# Patient Record
Sex: Female | Born: 1970 | Race: White | Hispanic: No | Marital: Married
Health system: Southern US, Community
[De-identification: ages and names within clinical notes are randomized; demographics above are authoritative.]

---

## 2022-02-13 ENCOUNTER — Inpatient Hospital Stay (HOSPITAL_COMMUNITY)
Admission: EM | Admit: 2022-02-13 | Discharge: 2022-02-17 | DRG: 035 | Disposition: A | Discharge status: Home / Self Care | Payer: No Typology Code available for payment source | Attending: Neurology | Admitting: Neurology

## 2022-02-13 ENCOUNTER — Emergency Department (HOSPITAL_COMMUNITY): Payer: No Typology Code available for payment source

## 2022-02-13 ENCOUNTER — Other Ambulatory Visit: Payer: Self-pay

## 2022-02-13 DIAGNOSIS — I63412 Cerebral infarction due to embolism of left middle cerebral artery: Principal | ICD-10-CM | POA: Diagnosis present

## 2022-02-13 DIAGNOSIS — I639 Cerebral infarction, unspecified: Secondary | ICD-10-CM | POA: Diagnosis not present

## 2022-02-13 DIAGNOSIS — R29707 NIHSS score 7: Secondary | ICD-10-CM | POA: Diagnosis present

## 2022-02-13 DIAGNOSIS — I959 Hypotension, unspecified: Secondary | ICD-10-CM | POA: Diagnosis not present

## 2022-02-13 DIAGNOSIS — I63512 Cerebral infarction due to unspecified occlusion or stenosis of left middle cerebral artery: Secondary | ICD-10-CM | POA: Diagnosis present

## 2022-02-13 DIAGNOSIS — I63232 Cerebral infarction due to unspecified occlusion or stenosis of left carotid arteries: Secondary | ICD-10-CM | POA: Diagnosis not present

## 2022-02-13 DIAGNOSIS — E7800 Pure hypercholesterolemia, unspecified: Secondary | ICD-10-CM | POA: Diagnosis not present

## 2022-02-13 DIAGNOSIS — E785 Hyperlipidemia, unspecified: Secondary | ICD-10-CM | POA: Diagnosis present

## 2022-02-13 DIAGNOSIS — I10 Essential (primary) hypertension: Secondary | ICD-10-CM | POA: Diagnosis present

## 2022-02-13 DIAGNOSIS — Z20822 Contact with and (suspected) exposure to covid-19: Secondary | ICD-10-CM | POA: Diagnosis present

## 2022-02-13 DIAGNOSIS — G43909 Migraine, unspecified, not intractable, without status migrainosus: Secondary | ICD-10-CM | POA: Diagnosis present

## 2022-02-13 DIAGNOSIS — Z9282 Status post administration of tPA (rtPA) in a different facility within the last 24 hours prior to admission to current facility: Secondary | ICD-10-CM | POA: Diagnosis not present

## 2022-02-13 DIAGNOSIS — D509 Iron deficiency anemia, unspecified: Secondary | ICD-10-CM | POA: Diagnosis present

## 2022-02-13 DIAGNOSIS — D649 Anemia, unspecified: Secondary | ICD-10-CM | POA: Diagnosis not present

## 2022-02-13 DIAGNOSIS — R4701 Aphasia: Secondary | ICD-10-CM | POA: Diagnosis present

## 2022-02-13 DIAGNOSIS — I69392 Facial weakness following cerebral infarction: Secondary | ICD-10-CM

## 2022-02-13 DIAGNOSIS — G8191 Hemiplegia, unspecified affecting right dominant side: Secondary | ICD-10-CM | POA: Diagnosis present

## 2022-02-13 DIAGNOSIS — I6522 Occlusion and stenosis of left carotid artery: Secondary | ICD-10-CM | POA: Diagnosis not present

## 2022-02-13 DIAGNOSIS — Z8669 Personal history of other diseases of the nervous system and sense organs: Secondary | ICD-10-CM | POA: Diagnosis not present

## 2022-02-13 DIAGNOSIS — Z8673 Personal history of transient ischemic attack (TIA), and cerebral infarction without residual deficits: Secondary | ICD-10-CM | POA: Diagnosis not present

## 2022-02-13 LAB — COMPREHENSIVE METABOLIC PANEL
ALT: 18 U/L (ref 0–44)
AST: 21 U/L (ref 15–41)
Albumin: 3.4 g/dL — ABNORMAL LOW (ref 3.5–5.0)
Alkaline Phosphatase: 91 U/L (ref 38–126)
Anion gap: 11 (ref 5–15)
BUN: 9 mg/dL (ref 6–20)
CO2: 21 mmol/L — ABNORMAL LOW (ref 22–32)
Calcium: 9.3 mg/dL (ref 8.9–10.3)
Chloride: 103 mmol/L (ref 98–111)
Creatinine, Ser: 0.87 mg/dL (ref 0.44–1.00)
GFR, Estimated: >60 mL/min (ref >60)
Glucose, Bld: 141 mg/dL — ABNORMAL HIGH (ref 70–99)
Potassium: 3.8 mmol/L (ref 3.5–5.1)
Sodium: 135 mmol/L (ref 135–145)
Total Bilirubin: 0.5 mg/dL (ref 0.3–1.2)
Total Protein: 7.1 g/dL (ref 6.5–8.1)

## 2022-02-13 LAB — DIFFERENTIAL
Abs Immature Granulocytes: 0.05 10*3/uL (ref 0.00–0.07)
Basophils Absolute: 0 10*3/uL (ref 0.0–0.1)
Basophils Relative: 0 %
Eosinophils Absolute: 0.6 10*3/uL — ABNORMAL HIGH (ref 0.0–0.5)
Eosinophils Relative: 5 %
Immature Granulocytes: 1 %
Lymphocytes Relative: 29 %
Lymphs Abs: 3.1 10*3/uL (ref 0.7–4.0)
Monocytes Absolute: 0.7 10*3/uL (ref 0.1–1.0)
Monocytes Relative: 6 %
Neutro Abs: 6.1 10*3/uL (ref 1.7–7.7)
Neutrophils Relative %: 59 %

## 2022-02-13 LAB — I-STAT BETA HCG BLOOD, ED (MC, WL, AP ONLY): I-stat hCG, quantitative: <5 m[IU]/mL (ref <5)

## 2022-02-13 LAB — I-STAT CHEM 8, ED
BUN: 10 mg/dL (ref 6–20)
Calcium, Ion: 1.18 mmol/L (ref 1.15–1.40)
Chloride: 105 mmol/L (ref 98–111)
Creatinine, Ser: 0.7 mg/dL (ref 0.44–1.00)
Glucose, Bld: 98 mg/dL (ref 70–99)
HCT: 36 % (ref 36.0–46.0)
Hemoglobin: 12.2 g/dL (ref 12.0–15.0)
Potassium: 4.1 mmol/L (ref 3.5–5.1)
Sodium: 136 mmol/L (ref 135–145)
TCO2: 21 mmol/L — ABNORMAL LOW (ref 22–32)

## 2022-02-13 LAB — CBC
HCT: 32.3 % — ABNORMAL LOW (ref 36.0–46.0)
Hemoglobin: 9.6 g/dL — ABNORMAL LOW (ref 12.0–15.0)
MCH: 20.3 pg — ABNORMAL LOW (ref 26.0–34.0)
MCHC: 29.7 g/dL — ABNORMAL LOW (ref 30.0–36.0)
MCV: 68.4 fL — ABNORMAL LOW (ref 80.0–100.0)
Platelets: 422 10*3/uL — ABNORMAL HIGH (ref 150–400)
RBC: 4.72 MIL/uL (ref 3.87–5.11)
RDW: 17 % — ABNORMAL HIGH (ref 11.5–15.5)
WBC: 10.5 10*3/uL (ref 4.0–10.5)
nRBC: 0 % (ref 0.0–0.2)

## 2022-02-13 LAB — ETHANOL: Alcohol, Ethyl (B): <10 mg/dL (ref <10)

## 2022-02-13 LAB — HEMOGLOBIN A1C
Hgb A1c MFr Bld: 5.6 % (ref 4.8–5.6)
Mean Plasma Glucose: 114.02 mg/dL

## 2022-02-13 LAB — PROTIME-INR
INR: 1 (ref 0.8–1.2)
Prothrombin Time: 12.6 seconds (ref 11.4–15.2)

## 2022-02-13 LAB — CBG MONITORING, ED: Glucose-Capillary: 95 mg/dL (ref 70–99)

## 2022-02-13 LAB — APTT: aPTT: 22 seconds — ABNORMAL LOW (ref 24–36)

## 2022-02-13 IMAGING — MR MR HEAD W/O CM
9 series · 48 of 48 positions shown · non-contrast
Comparison: No prior MRI, correlation is made with [DATE] CT
head and CTA head neck

CLINICAL DATA: Right facial droop, right-sided weakness

EXAM:
MRI HEAD WITHOUT CONTRAST
TECHNIQUE: Multiplanar, multiecho pulse sequences of the brain and surrounding
structures were obtained without intravenous contrast.

[Series 5: DWI · axial · 3.0mm · 0.88mm/px · z∈[-97,+50]mm · 11 of 100 slices shown (1 of 4)]
[im 1/100]
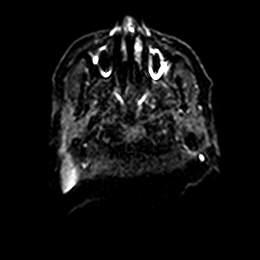
[im 10/100]
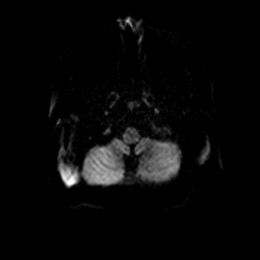
[im 20/100]
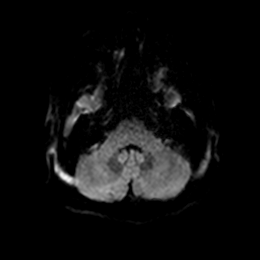
[im 30/100]
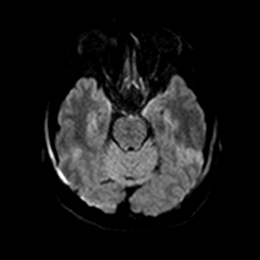
[im 40/100]
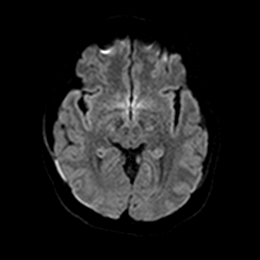
[im 50/100]
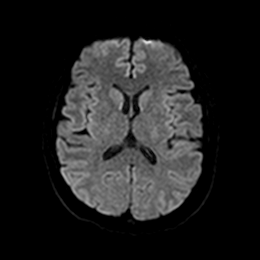
[im 60/100]
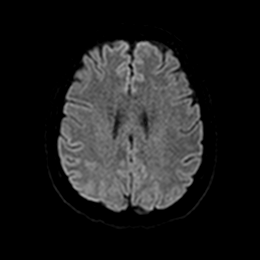
[im 70/100]
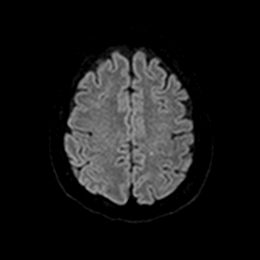
[im 80/100]
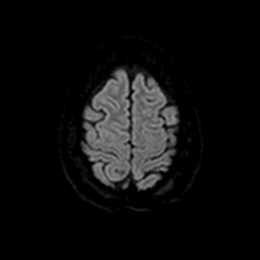
[im 90/100]
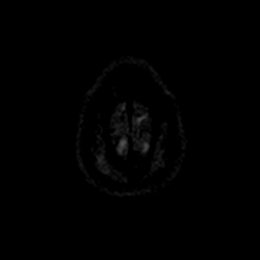
[im 100/100]
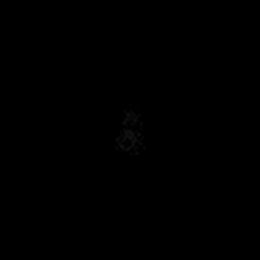

[Series 6: DWI · axial · 3.0mm · 0.88mm/px · z∈[-97,+50]mm · 6 of 50 slices shown (2 of 4)]
[im 1/50]
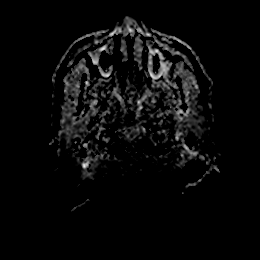
[im 10/50]
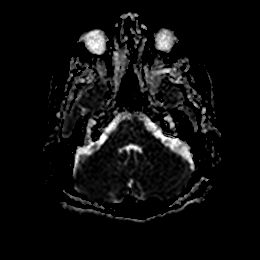
[im 20/50]
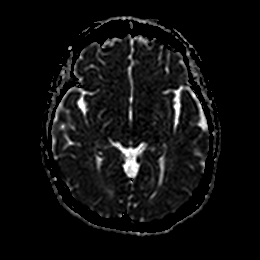
[im 30/50]
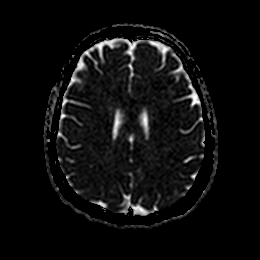
[im 40/50]
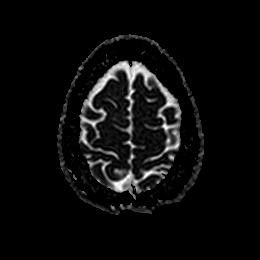
[im 50/50]
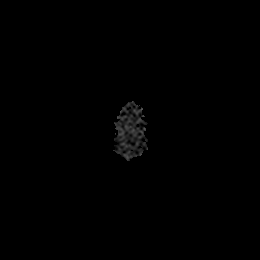

[Series 7: DWI · coronal · 4.0mm · 0.88mm/px · 7 of 64 slices shown (3 of 4)]
[im 1/64]
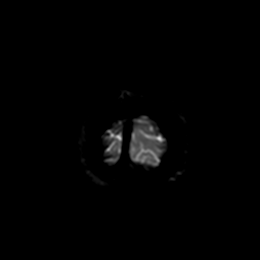
[im 11/64]
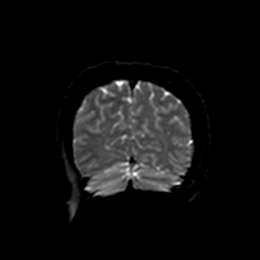
[im 22/64]
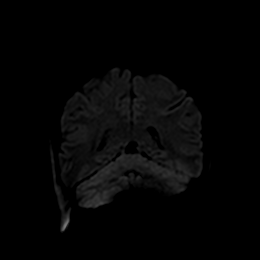
[im 32/64]
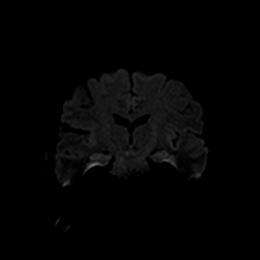
[im 43/64]
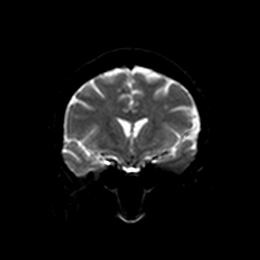
[im 53/64]
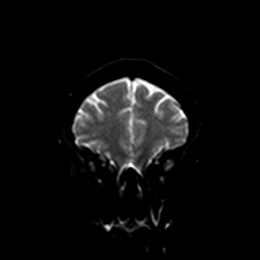
[im 64/64]
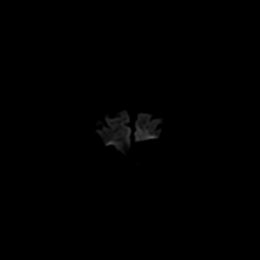

[Series 8: DWI · coronal · 4.0mm · 0.88mm/px · 4 of 32 slices shown (4 of 4)]
[im 1/32]
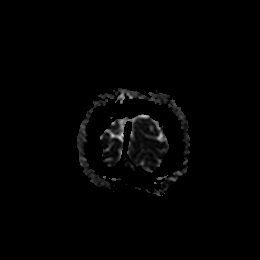
[im 11/32]
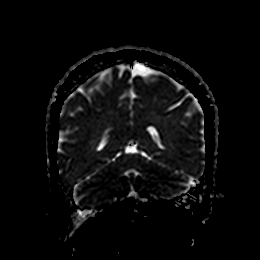
[im 21/32]
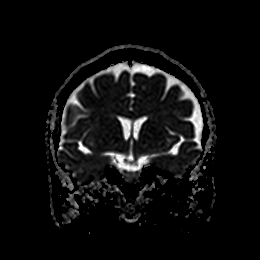
[im 32/32]
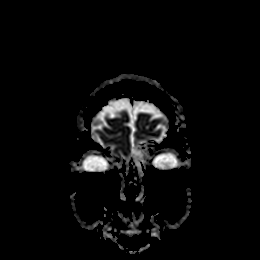

[Series 9: T1 · sagittal · 5.0mm · 0.75mm/px · 3 of 23 slices shown]
[im 1/23]
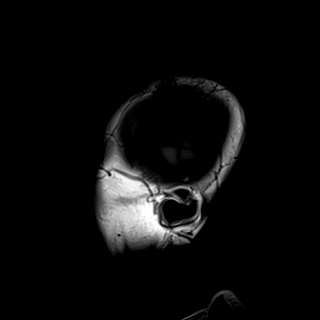
[im 12/23]
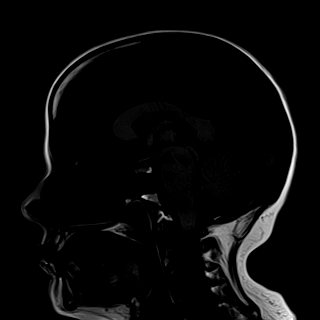
[im 23/23]
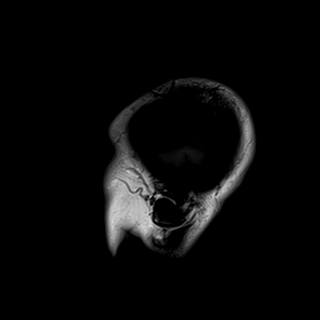

[Series 10: T2 · axial · 5.0mm · 0.72mm/px · z∈[-95,+49]mm · 3 of 25 slices shown]
[im 1/25]
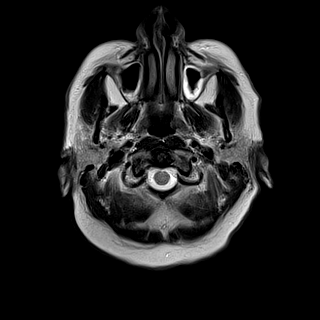
[im 13/25]
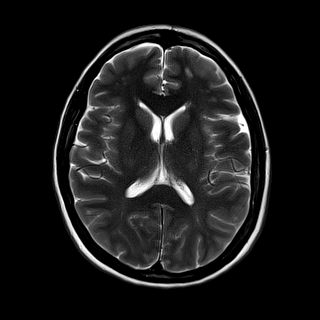
[im 25/25]
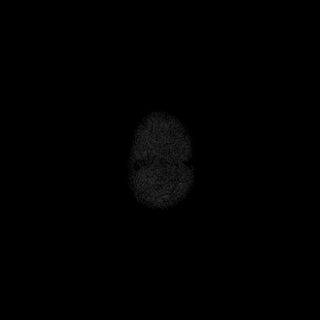

[Series 12: pha_images · axial · 3.0mm · 0.90mm/px · z∈[-112,+59]mm · 6 of 56 slices shown]
[im 1/56]
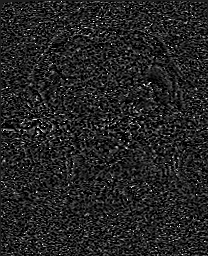
[im 12/56]
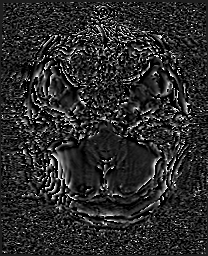
[im 23/56]
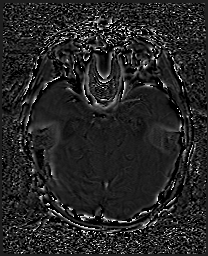
[im 34/56]
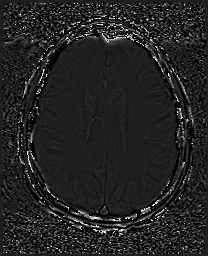
[im 45/56]
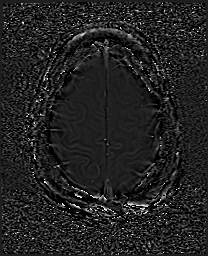
[im 56/56]
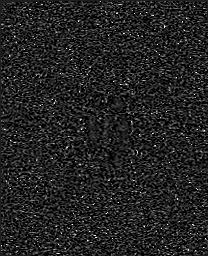

[Series 13: swi_images · axial · 3.0mm · 0.90mm/px · z∈[-112,+65]mm · 7 of 60 slices shown]
[im 1/60]
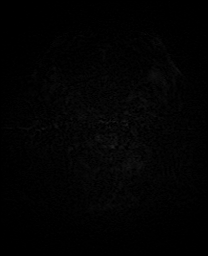
[im 10/60]
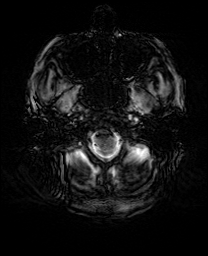
[im 20/60]
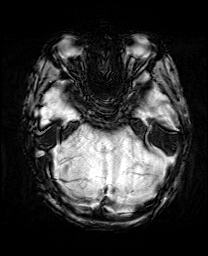
[im 30/60]
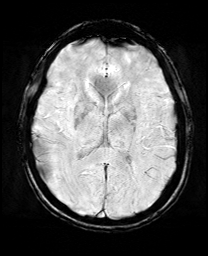
[im 40/60]
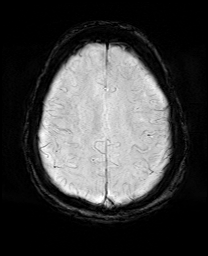
[im 50/60]
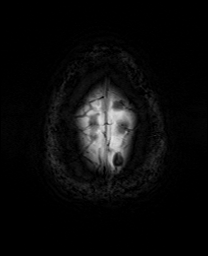
[im 60/60]
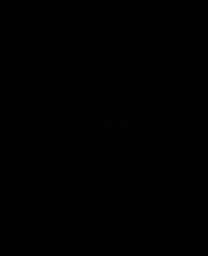

[Series 15: FLAIR · axial · 5.0mm · 0.45mm/px · 1 of 13 slices shown]
[im 1/13]
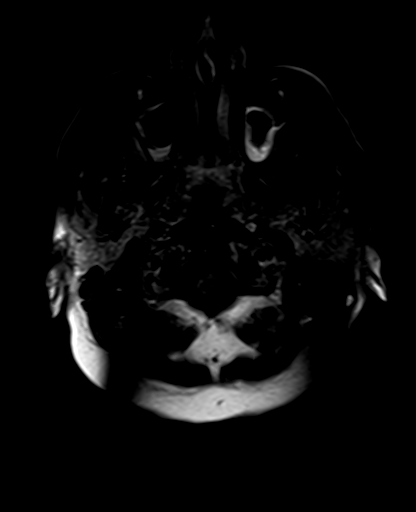

[48 of 48 positions shown; findings below may reference images not displayed]

FINDINGS: Evaluation is somewhat limited by early termination of the study. No
axial T1 sequence was obtained.

Brain: Punctate foci of restricted diffusion in the left parietal
lobe (series 5, images 79-80) and left frontal lobe (series 5,
images 81-85 and 88). No acute hemorrhage, mass, mass effect, or
midline shift. No hydrocephalus or extra-axial collection. Scattered
T2 hyperintense signal in the periventricular white matter, likely
the sequela of chronic small vessel ischemic disease.

Vascular: Normal flow voids.

Skull and upper cervical spine: Normal marrow signal.

Sinuses/Orbits: Mucosal thickening in the left frontal sinus,
ethmoid air cells, and maxillary sinuses.

Other: The mastoids are well aerated.
IMPRESSION: Punctate acute infarcts in the left frontal and parietal lobes.

## 2022-02-13 IMAGING — CT CT HEAD CODE STROKE
3 series · 15 of 47 positions shown, 18 images · non-contrast
Comparison: None Available.

CLINICAL DATA: Code stroke.  Stroke suspected



[Series 3: head 5.0 st · axial · 0.43mm/px · z∈[-139,-4]mm · 9 of 33 slices shown, 12 images]
[im 3/33  brain]
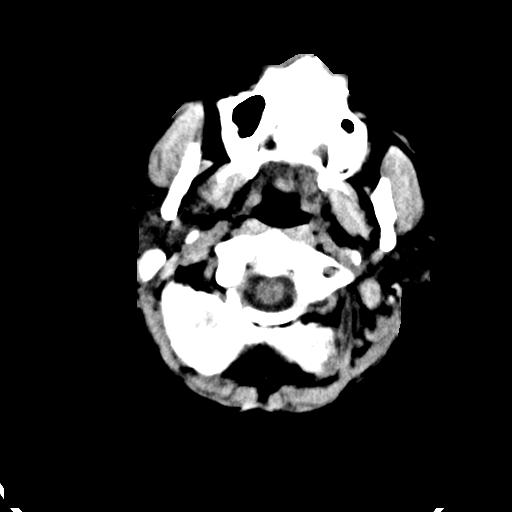
[im 3/33  bone]
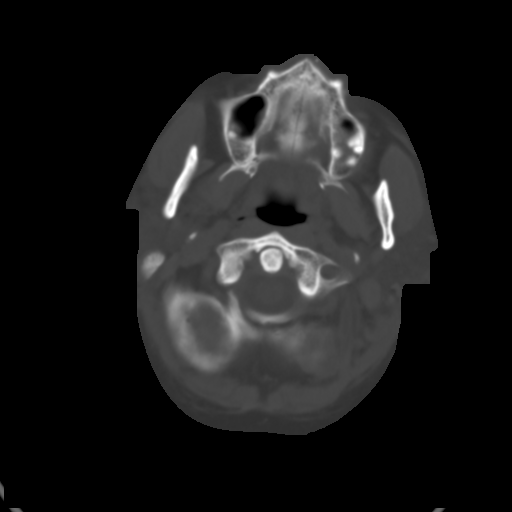
[im 6/33  brain]
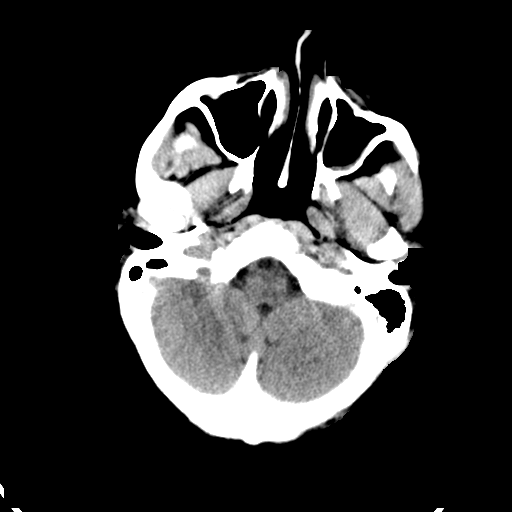
[im 9/33  brain]
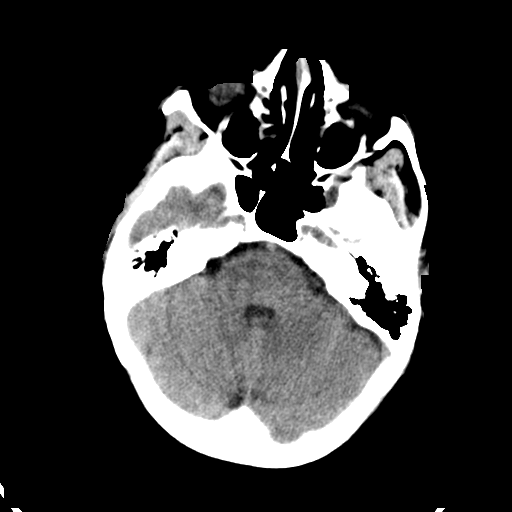
[im 13/33  brain]
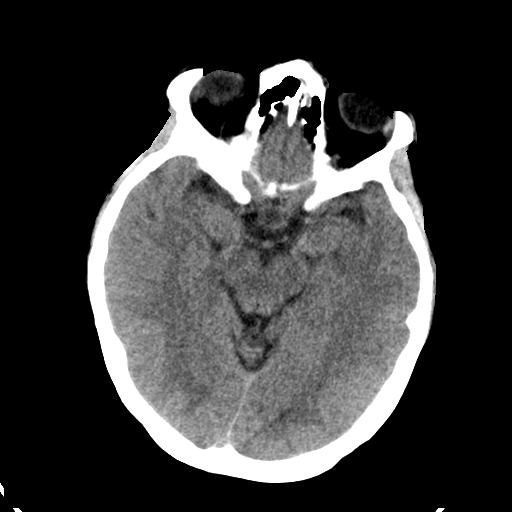
[im 17/33  brain]
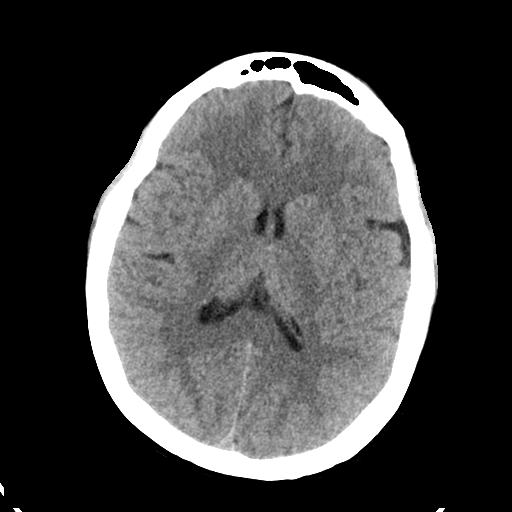
[im 17/33  bone]
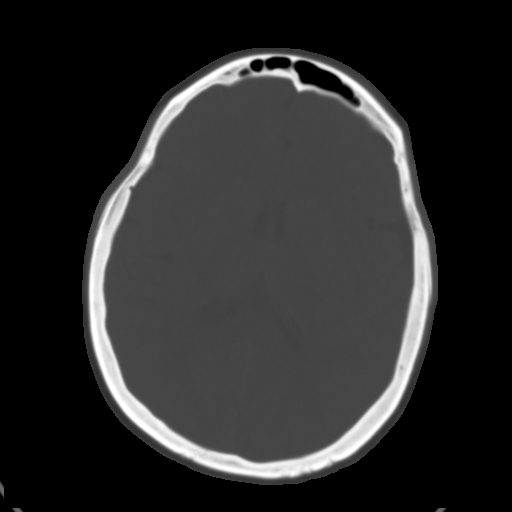
[im 20/33  brain]
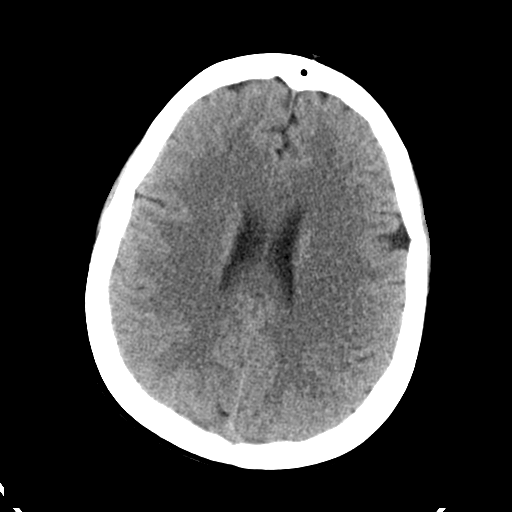
[im 24/33  brain]
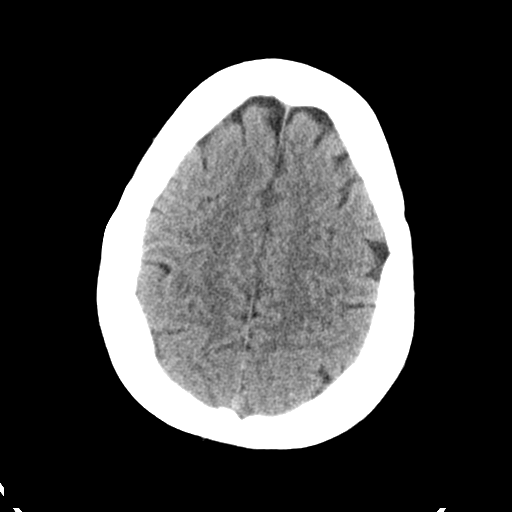
[im 27/33  brain]
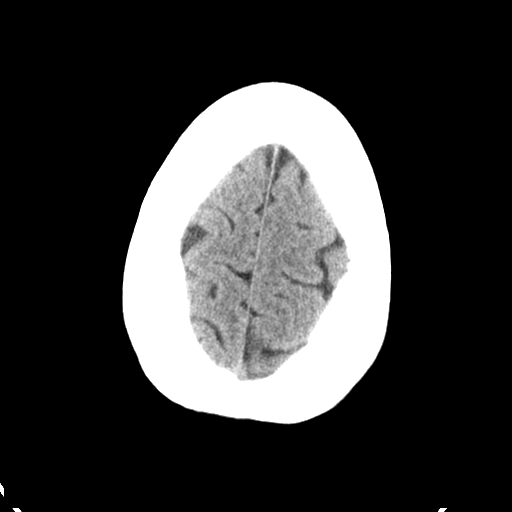
[im 30/33  brain]
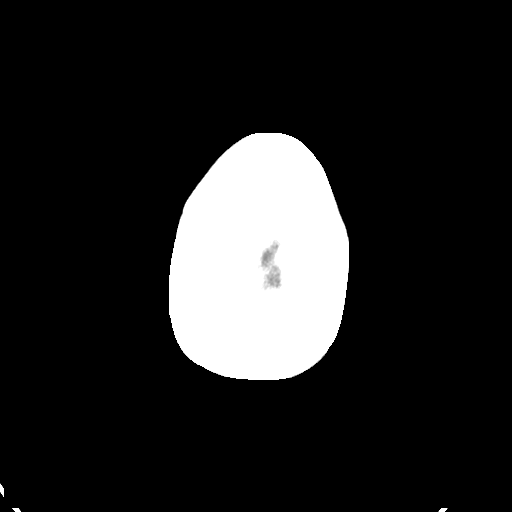
[im 30/33  bone]
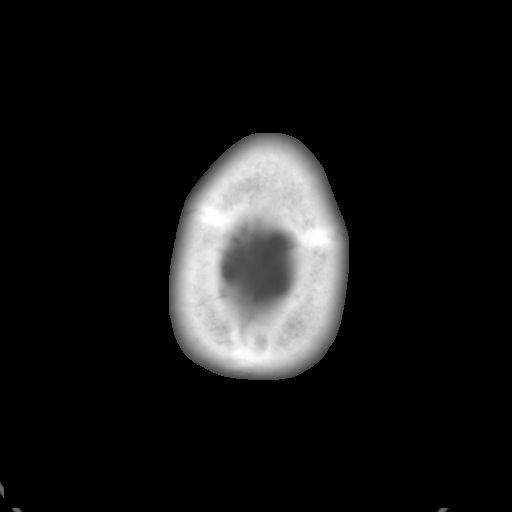

[Series 5: head 3.0 cor st · coronal · 0.35mm/px · 3 of 69 slices shown]
[im 23/69  brain]
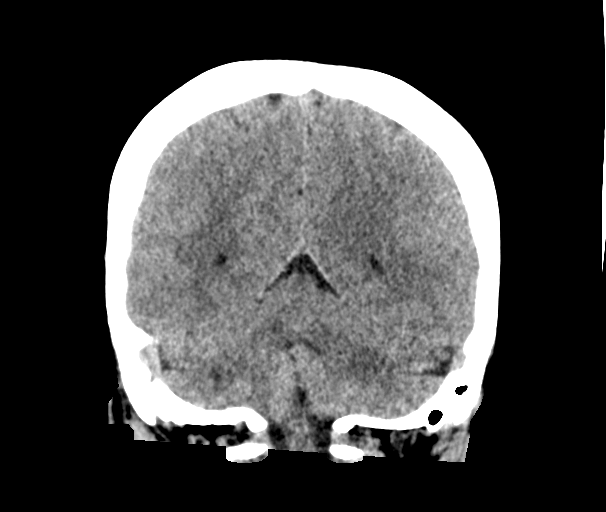
[im 31/69  brain]
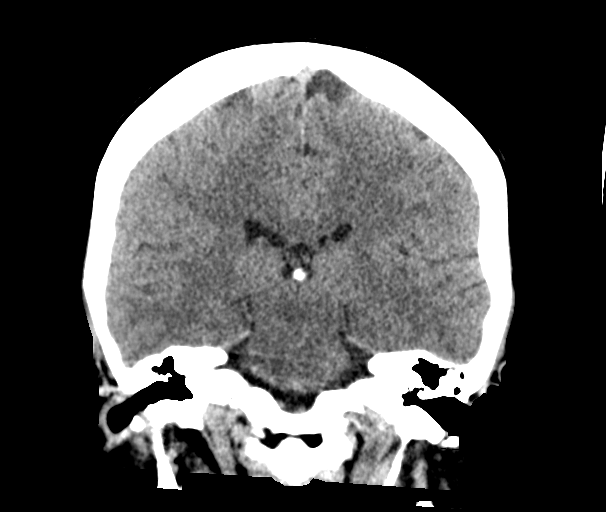
[im 38/69  brain]
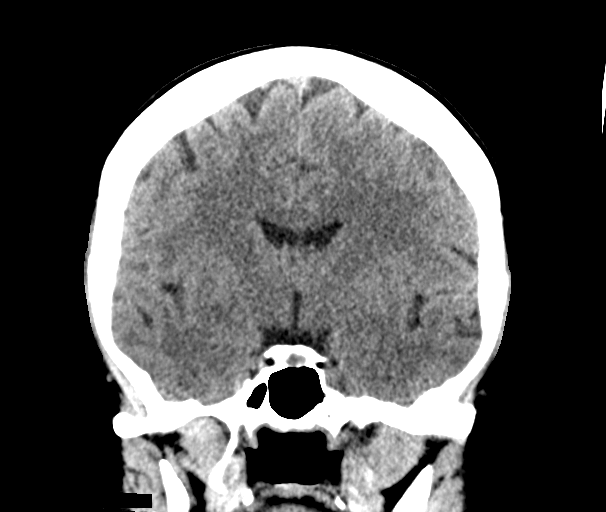

[Series 6: head 3.0 sag st · sagittal · 0.37mm/px · 3 of 58 slices shown]
[im 20/58  brain]
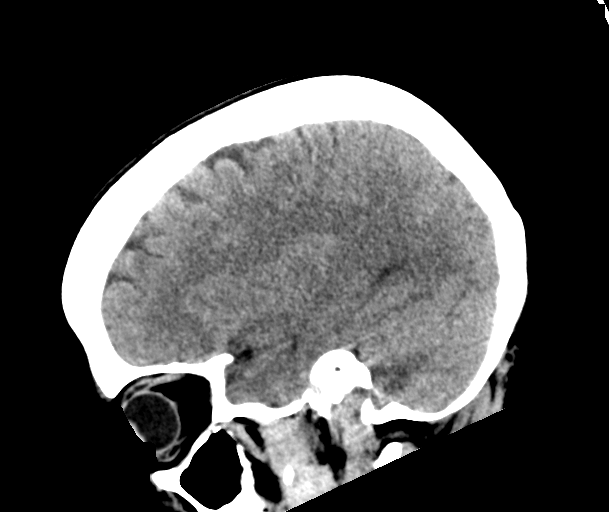
[im 29/58  brain]
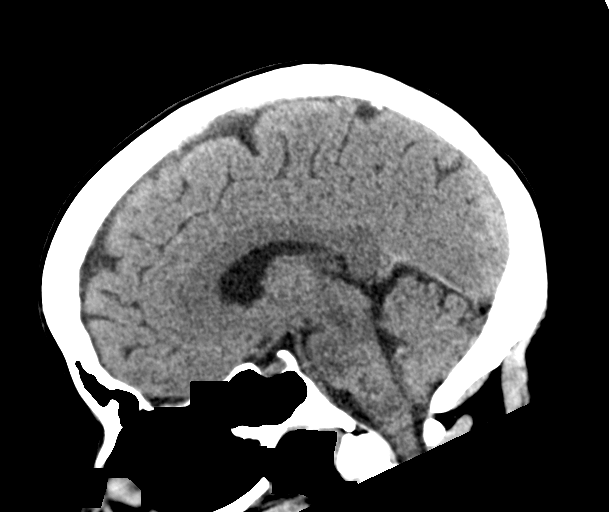
[im 39/58  brain]
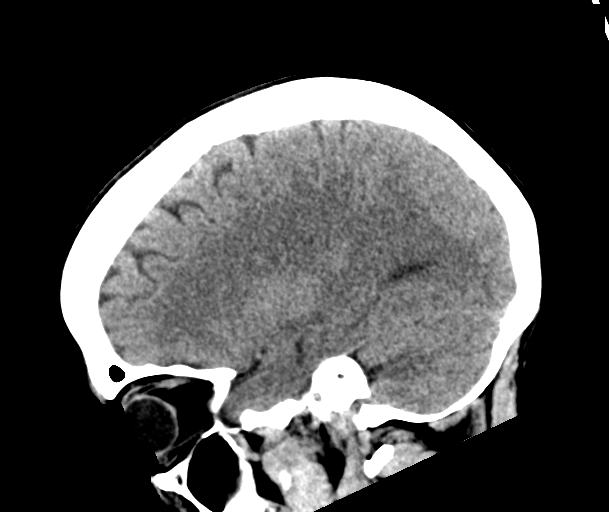

[15 of 47 positions shown; findings below may reference images not displayed]

FINDINGS: Brain: No evidence of acute infarction, hemorrhage, cerebral edema,
mass, mass effect, or midline shift. Ventricles and sulci are normal
for age. No extra-axial fluid collection.

Vascular: No hyperdense vessel.

Skull: Negative for fracture or focal lesion.

Sinuses/Orbits: Mucosal thickening in the maxillary sinuses, ethmoid
air cells, and left frontal sinus. The orbits are unremarkable.

Other: The mastoid air cells are well aerated.

ASPECTS (Alberta Stroke Program Early CT Score)

- Ganglionic level infarction (caudate, lentiform nuclei, internal
capsule, insula, M1-M3 cortex): 7

- Supraganglionic infarction (M4-M6 cortex): 3

Total score (0-10 with 10 being normal): 10
IMPRESSION: 1. No acute intracranial process.
2. ASPECTS is 10

Code stroke imaging results were communicated on [DATE] at [DATE] to provider ERARSLAN via secure text paging.

## 2022-02-13 MED ORDER — SENNOSIDES-DOCUSATE SODIUM 8.6-50 MG PO TABS: 1.0000 | ORAL_TABLET | Freq: Every evening | ORAL | Status: DC | PRN

## 2022-02-13 MED ORDER — ACETAMINOPHEN 650 MG RE SUPP: 650.0000 mg | RECTAL | Status: DC | PRN

## 2022-02-13 MED ORDER — TENECTEPLASE FOR STROKE
0.2500 mg/kg | PACK | Freq: Once | INTRAVENOUS | Status: AC
  Administered 2022-02-13: 20 mg via INTRAVENOUS
  Filled 2022-02-13: qty 10

## 2022-02-13 MED ORDER — LABETALOL HCL 5 MG/ML IV SOLN: 5.0000 mg | INTRAVENOUS | Status: DC | PRN

## 2022-02-13 MED ORDER — CHLORHEXIDINE GLUCONATE CLOTH 2 % EX PADS
6.0000 | MEDICATED_PAD | Freq: Every day | CUTANEOUS | Status: DC
  Administered 2022-02-14 – 2022-02-16 (×2): 6 via TOPICAL

## 2022-02-13 MED ORDER — LABETALOL HCL 5 MG/ML IV SOLN
20.0000 mg | Freq: Once | INTRAVENOUS | Status: AC
  Administered 2022-02-13: 20 mg via INTRAVENOUS

## 2022-02-13 MED ORDER — ONDANSETRON HCL 4 MG/2ML IJ SOLN
4.0000 mg | Freq: Four times a day (QID) | INTRAMUSCULAR | Status: DC | PRN
  Administered 2022-02-13: 4 mg via INTRAVENOUS
  Filled 2022-02-13 (×2): qty 2

## 2022-02-13 MED ORDER — ACETAMINOPHEN 160 MG/5ML PO SOLN: 650.0000 mg | ORAL | Status: DC | PRN

## 2022-02-13 MED ORDER — FENTANYL CITRATE PF 50 MCG/ML IJ SOSY
PREFILLED_SYRINGE | INTRAMUSCULAR | Status: AC
  Filled 2022-02-13: qty 1

## 2022-02-13 MED ORDER — PANTOPRAZOLE SODIUM 40 MG IV SOLR
40.0000 mg | Freq: Every day | INTRAVENOUS | Status: DC
  Administered 2022-02-13: 40 mg via INTRAVENOUS
  Filled 2022-02-13: qty 10

## 2022-02-13 MED ORDER — SODIUM CHLORIDE 0.9 % IV SOLN: INTRAVENOUS | Status: DC

## 2022-02-13 MED ORDER — FENTANYL CITRATE PF 50 MCG/ML IJ SOSY
25.0000 ug | PREFILLED_SYRINGE | Freq: Once | INTRAMUSCULAR | Status: AC
  Administered 2022-02-13: 25 ug via INTRAVENOUS

## 2022-02-13 MED ORDER — STROKE: EARLY STAGES OF RECOVERY BOOK
Freq: Once | Status: AC
  Filled 2022-02-13: qty 1

## 2022-02-13 MED ORDER — ACETAMINOPHEN 325 MG PO TABS
650.0000 mg | ORAL_TABLET | ORAL | Status: DC | PRN
  Administered 2022-02-14 (×3): 650 mg via ORAL
  Filled 2022-02-13 (×3): qty 2

## 2022-02-13 MED ORDER — IOHEXOL 350 MG/ML SOLN
100.0000 mL | Freq: Once | INTRAVENOUS | Status: AC | PRN
  Administered 2022-02-13: 100 mL via INTRAVENOUS

## 2022-02-13 NOTE — ED Triage Notes (Signed)
Pt presents via EMS was reportedly on the way home from dinner and experienced right sided arm paralysis and right leg heaviness as well as trouble finding her words.

## 2022-02-13 NOTE — Progress Notes (Signed)
PHARMACIST CODE STROKE RESPONSE  Notified to mix TNK at 2222  by Dr. Iver Nestle Delivered TNK to RN at 1035  TNK dose = 20 mg IV over 5 seconds  Issues/delays encountered (if applicable): Decision to give TPA was made while patient was in MRI. Upon arrival symptoms were questionable and CT scan did not show large vessel occlusion. The notification to mix was called to the pharmacist working in the main pharmacy after the ED pharmacist left for the evening. BP initially high and labetolol was given prior to delivering TNKase to RN.  Ruben Im, PharmD Clinical Pharmacist 02/13/2022 10:59 PM Please check AMION for all [facility] numbers

## 2022-02-13 NOTE — ED Provider Notes (Signed)
[facility] EMERGENCY DEPARTMENT Provider Note   CSN: [id number] Arrival date & time: 02/13/22  2114     History  No chief complaint on file.   Erika Jordan is a 51 y.o. female.  Level 5 caveat for acuity of condition.  Patient arrives via EMS as code stroke.  She was apparently driving in the car with family when she developed weakness, numbness to her right arm and right face with difficulty speaking.  Last seen normal about 8:35 PM.  Patient with difficulty communicating.  She complains of a headache and right-sided weakness and difficulty speaking.  Reportedly has no medical conditions and takes no medications. She does report a history of migraines but has never had a headache like this in the past and has never had the weakness from her headaches in the past.  The history is provided by the patient and the EMS personnel. The history is limited by the condition of the patient.      Home Medications Prior to Admission medications   Not on File      Allergies    Patient has no allergy information on record.    Review of Systems   Review of Systems  Unable to perform ROS: Acuity of condition   Physical Exam Updated Vital Signs BP (!) 168/89   Pulse 71   Temp 98 F (36.7 C) (Oral)   Resp 20   Wt 79.6 kg   SpO2 100%  Physical Exam Vitals and nursing note reviewed.  Constitutional:      General: She is not in acute distress.    Appearance: She is well-developed.  HENT:     Head: Normocephalic and atraumatic.     Mouth/Throat:     Pharynx: No oropharyngeal exudate.  Eyes:     Conjunctiva/sclera: Conjunctivae normal.     Pupils: Pupils are equal, round, and reactive to light.  Neck:     Comments: No meningismus. Cardiovascular:     Rate and Rhythm: Normal rate and regular rhythm.     Heart sounds: Normal heart sounds. No murmur heard. Pulmonary:     Effort: Pulmonary effort is normal. No respiratory distress.     Breath sounds: Normal  breath sounds.  Abdominal:     Palpations: Abdomen is soft.     Tenderness: There is no abdominal tenderness. There is no guarding or rebound.  Musculoskeletal:        General: No tenderness. Normal range of motion.     Cervical back: Normal range of motion and neck supple.  Skin:    General: Skin is warm.  Neurological:     Mental Status: She is alert and oriented to person, place, and time.     Cranial Nerves: No cranial nerve deficit.     Motor: No abnormal muscle tone.     Coordination: Coordination normal.     Comments: Decreased grip strength on the right, difficulty raising right arm off the bed.  Decreased strength raising right leg off the bed.  5/5 strength on the left.  No facial asymmetry.  Tongue is midline. Pronator drift present on the right  Psychiatric:        Behavior: Behavior normal.    ED Results / Procedures / Treatments   Labs (all labs ordered are listed, but only abnormal results are displayed) Labs Reviewed  APTT - Abnormal; Notable for the following components:      Result Value   aPTT 22 (*)    All  other components within normal limits  CBC - Abnormal; Notable for the following components:   Hemoglobin 9.6 (*)    HCT 32.3 (*)    MCV 68.4 (*)    MCH 20.3 (*)    MCHC 29.7 (*)    RDW 17.0 (*)    Platelets 422 (*)    All other components within normal limits  DIFFERENTIAL - Abnormal; Notable for the following components:   Eosinophils Absolute 0.6 (*)    All other components within normal limits  COMPREHENSIVE METABOLIC PANEL - Abnormal; Notable for the following components:   CO2 21 (*)    Glucose, Bld 141 (*)    Albumin 3.4 (*)    All other components within normal limits  I-STAT CHEM 8, ED - Abnormal; Notable for the following components:   TCO2 21 (*)    All other components within normal limits  RESP PANEL BY RT-PCR (FLU A&B, COVID) ARPGX2  ETHANOL  PROTIME-INR  HEMOGLOBIN A1C  RAPID URINE DRUG SCREEN, HOSP PERFORMED  URINALYSIS,  ROUTINE W REFLEX MICROSCOPIC  LIPID PANEL  HIV ANTIBODY (ROUTINE TESTING W REFLEX)  CBG MONITORING, ED  I-STAT BETA HCG BLOOD, ED ([facility], [facility], [facility] ONLY)    EKG EKG Interpretation  Date/Time:  Sunday Feb 13 2022 21:53:30 EDT Ventricular Rate:  88 PR Interval:  134 QRS Duration: 89 QT Interval:  374 QTC Calculation: 453 R Axis:   38 Text Interpretation: Sinus or ectopic atrial rhythm Low voltage, precordial leads No previous ECGs available Confirmed by Glynn Octave [phone]) on 02/13/2022 9:55:45 PM  Radiology MR BRAIN WO CONTRAST  Result Date: 02/13/2022 CLINICAL DATA:  Right facial droop, right-sided weakness EXAM: MRI HEAD WITHOUT CONTRAST TECHNIQUE: Multiplanar, multiecho pulse sequences of the brain and surrounding structures were obtained without intravenous contrast. COMPARISON:  No prior MRI, correlation is made with 02/13/2022 CT head and CTA head neck FINDINGS: Evaluation is somewhat limited by early termination of the study. No axial T1 sequence was obtained. Brain: Punctate foci of restricted diffusion in the left parietal lobe (series 5, images 79-80) and left frontal lobe (series 5, images 81-85 and 88). No acute hemorrhage, mass, mass effect, or midline shift. No hydrocephalus or extra-axial collection. Scattered T2 hyperintense signal in the periventricular white matter, likely the sequela of chronic small vessel ischemic disease. Vascular: Normal flow voids. Skull and upper cervical spine: Normal marrow signal. Sinuses/Orbits: Mucosal thickening in the left frontal sinus, ethmoid air cells, and maxillary sinuses. Other: The mastoids are well aerated. 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 Electronically Signed   By: Wiliam Ke M.D.   On: 02/13/2022 23:00   CT HEAD CODE STROKE WO CONTRAST  Result Date: 02/13/2022 CLINICAL DATA:  Code stroke.  Stroke suspected EXAM: CT HEAD WITHOUT CONTRAST TECHNIQUE: Contiguous axial images were obtained  from the base of the skull through the vertex without intravenous contrast. RADIATION DOSE REDUCTION: This exam was performed according to the departmental dose-optimization program which includes automated exposure control, adjustment of the mA and/or kV according to patient size and/or use of iterative reconstruction technique. COMPARISON:  None Available. FINDINGS: Brain: No evidence of acute infarction, hemorrhage, cerebral edema, mass, mass effect, or midline shift. Ventricles and sulci are normal for age. No extra-axial fluid collection. Vascular: No hyperdense vessel. Skull: Negative for fracture or focal lesion. Sinuses/Orbits: Mucosal thickening in the maxillary sinuses, ethmoid air cells, and left frontal sinus. The orbits are unremarkable. Other: The mastoid air cells are well aerated.  ASPECTS [facility](Alberta Stroke Program Early CT Score) - Ganglionic level infarction (caudate, lentiform nuclei, internal capsule, insula, M1-M3 cortex): 7 - Supraganglionic infarction (M4-M6 cortex): 3 Total score (0-10 with 10 being normal): 10 IMPRESSION: 1. No acute intracranial process. 2. ASPECTS is 10 Code stroke imaging results were communicated on 02/13/2022 at 9:27 pm to provider BHAGAT via secure text paging. Electronically Signed   By: Wiliam KeAlison  Vasan M.D.   On: 02/13/2022 21:28   CT ANGIO HEAD NECK W WO CM W PERF (CODE STROKE)  Result Date: 02/13/2022 CLINICAL DATA:  Right facial droop, right-sided weakness, stroke suspected EXAM: CT ANGIOGRAPHY HEAD AND NECK CT PERFUSION TECHNIQUE: Multidetector CT imaging of the head and neck was performed using the standard protocol during bolus administration of intravenous contrast. Multiplanar CT image reconstructions and MIPs were obtained to evaluate the vascular anatomy. Carotid stenosis measurements (when applicable) are obtained utilizing NASCET criteria, using the distal internal carotid diameter as the denominator. Multiphase CT imaging of the brain was performed  following IV bolus contrast injection. Subsequent parametric perfusion maps were calculated using RAPID software. RADIATION DOSE REDUCTION: This exam was performed according to the departmental dose-optimization program which includes automated exposure control, adjustment of the mA and/or kV according to patient size and/or use of iterative reconstruction technique. CONTRAST:  100 mL Omnipaque 350 COMPARISON:  No prior CTA, correlation is made with CT head 02/13/2022 FINDINGS: CT HEAD FINDINGS For noncontrast findings, please see same day CT head. CTA NECK FINDINGS Aortic arch: Standard branching. Imaged portion shows no evidence of aneurysm or dissection. No significant stenosis of the major arch vessel origins. Right carotid system: No evidence of dissection, occlusion, or hemodynamically significant stenosis (greater than 50%). Left carotid system: Filling defect along the medial aspect of the proximal left ICA, extending into the lumen (series 7, images 202-204), which may represent intraluminal thrombus. No evidence of dissection, occlusion, or hemodynamically significant stenosis (greater than 50%). Vertebral arteries: No evidence of dissection, occlusion, or hemodynamically significant stenosis (greater than 50%). Skeleton: No acute osseous abnormality. Other neck: No acute finding. Upper chest: No focal pulmonary opacity or pleural effusion. Review of the MIP images confirms the above findings CTA HEAD FINDINGS Anterior circulation: Both internal carotid arteries are patent to the termini, without significant stenosis. A1 segments patent. Normal anterior communicating artery. Anterior cerebral arteries are patent to their distal aspects. No M1 stenosis or occlusion, with irregularity of the left-greater-than-right M1 normal MCA bifurcations. Distal MCA branches perfused and symmetric. Posterior circulation: Vertebral arteries patent to the vertebrobasilar junction without stenosis. Posterior inferior  cerebellar arteries patent proximally. Basilar patent to its distal aspect. Superior cerebellar arteries patent proximally. Patent P1 segments. PCAs perfused to their distal aspects without stenosis. The right posterior communicating artery is patent. Venous sinuses: As permitted by contrast timing, patent. Anatomic variants: None significant. Review of the MIP images confirms the above findings CT Brain Perfusion Findings: ASPECTS: 10 CBF (<30%) Volume: 0mL Perfusion (Tmax>6.0s) volume: 0mL Mismatch Volume: 0mL Infarction Location:None IMPRESSION: 1. Filling defect in the proximal left ICA, concerning for intraluminal thrombus. No hemodynamically significant stenosis in the neck. 2.  No intracranial large vessel occlusion or significant stenosis. 3. No infarct core or penumbra on CT perfusion. Code stroke imaging results were communicated on 02/13/2022 at 10:06 pm to provider BHAGAT via secure text paging. Electronically Signed   By: Wiliam KeAlison  Vasan M.D.   On: 02/13/2022 22:07    Procedures .Critical Care Performed by: Glynn Octaveancour, Felix Pratt, MD Authorized by: Glynn Octaveancour, Lima Chillemi, MD  Critical care provider statement:    Critical care time (minutes):  45   Critical care time was exclusive of:  Separately billable procedures and treating other patients   Critical care was necessary to treat or prevent imminent or life-threatening deterioration of the following conditions:  CNS failure or compromise   Critical care was time spent personally by me on the following activities:  Development of treatment plan with patient or surrogate, discussions with consultants, evaluation of patient's response to treatment, examination of patient, ordering and review of laboratory studies, ordering and review of radiographic studies, ordering and performing treatments and interventions, pulse oximetry, re-evaluation of patient's condition and review of old charts   I assumed direction of critical care for this patient from another  provider in my specialty: no     Care discussed with: admitting provider      Medications Ordered in ED Medications - No data to display  ED Course/ Medical Decision Making/ A&P                           Medical Decision Making Amount and/or Complexity of Data Reviewed Labs: ordered. Radiology: ordered.  Risk Prescription drug management. Decision regarding hospitalization.  Code stroke seen on arrival with Dr. Clemmie Krill neurology.  Patient taken emergently to CT scanner. Patient with right-sided weakness, difficulty speaking as well as a headache.  She does have a history of migraines but has never had this happen before with the weakness.  Uncertain if this headache is similar to her previous migraines  CT is negative for hemorrhage.  Results reviewed and interpreted by me.  CT concerning for filling defect in left carotid artery.  Patient taken emergently to MRI by neurology team and Dr. Iver Nestle.  MRI is positive for stroke.  Neurology planning TNK after discussion of risks and benefits as patient is within the window for lytics.  Hemoglobin 9.6, no comparison.  No history of recent bleeding episodes.  Patient to be given TNK per neurology recommendations.  She is agreeable after discussion of risk and benefits with neurology at bedside.  We will need admission to neuro ICU.  Angiocath insertion Performed by: Glynn Octave  Consent: Verbal consent obtained. Risks and benefits: risks, benefits and alternatives were discussed Time out: Immediately prior to procedure a "time out" was called to verify the correct patient, procedure, equipment, support staff and site/side marked as required.  Preparation: Patient was prepped and draped in the usual sterile fashion.  Vein Location: L AC  Ultrasound Guided  Gauge: 20  Normal blood return and flush without difficulty Patient tolerance: Patient tolerated the procedure well with no immediate  complications.         Final Clinical Impression(s) / ED Diagnoses Final diagnoses:  None    Rx / DC Orders ED Discharge Orders     None         Jacoria Keiffer, Jeannett Senior, MD 02/13/22 2322

## 2022-02-13 NOTE — Consult Note (Addendum)
Neurology Consultation Reason for Consult: Right sided weakness and aphasia Requesting Physician: Ezequiel Essex  CC: Right sided weakness and aphasia  History is obtained from: Primarily from daughter and EMS given patient's confusion  HPI: Erika Jordan is a 51 y.o. left-handed woman with a past medical history significant for migraine headaches (not on any medications), presenting with headache with right-sided weakness and difficulty speaking/confusion.  She is visiting her daughter Erika Jordan, who recently moved to the area from [place].  She was in her normal state of health today and had gone out to dinner with the family.  Shortly after returning the family had stepped out of the room briefly and came back to find her slumped over with right arm weakness and difficulty communicating, for which EMS was activated.  Her initial blood pressure with EMS was 220/140, glucose within normal limits, with her examination stable over the course of their evaluation and transport  Regarding her headaches, she has a known history of migraine headaches but does not take any preventative medications.  These headaches typically are associated with some confusion and difficulty talking, but she has never had right-sided weakness before.  She will sometimes take Excedrin for her headaches but otherwise does not take any other medications, and her only known allergy is penicillin  TNK checklist was reviewed with patient's daughter at bedside and she confirmed there were no contraindications to TNK  LKW: 18:35  tPA given?: Yes  Premorbid modified rankin scale:      0 - No symptoms. Delays:  Initial high concern for complex migraine, with advanced imaging obtained to confirm acute stroke. Vascular access was challenging and required multiple attempts Additionally there was some delay in obtaining contact information from the family to confirm no contraindications for TNK given patient's aphasia, family was not  initially at bedside and there is no contact information in her chart MRI was slightly delayed as patient was anxious, crying and requesting pain control prior to scan  ROS: Unable to obtain due to altered mental status.   No past medical history on file. Daughter and patient deny any history of clot  No family history on file. Maternal grandmother had many strokes and blood clots, mother also had some issue with a clogged artery in her neck requiring bypass graft from a leg vein  Social History:  has no history on file for tobacco use, alcohol use, and drug use.  Per history obtained from daughter the patient does not smoke, does not drink, and does not use any substances  Exam: Current vital signs: Wt 78.2 kg  Vital signs in last 24 hours: Weight:  [78.2 kg] 78.2 kg (05/28 2100)   Physical Exam  Constitutional: Appears well-developed and well-nourished.  Psych: Tearful and highly anxious Eyes: No scleral injection, continues to keep her right eye closed due to photophobia HENT: No oropharyngeal obstruction.  MSK: no joint deformities.  Cardiovascular: Normal rate and regular rhythm. Perfusing extremities well Respiratory: Effort normal, tachypneic GI: Soft.  No distension. There is no tenderness.  Skin: Warm dry and intact visible skin  Neuro: Mental Status: Patient is awake, but speech is very limited.  She has increased latency to respond, speech is stuttering, at times she makes some simple sentences that are clear but at other times she is unable to respond.  Naming and repetition are impaired and she has some mild dysarthria.  She is unable to tell me her age but is able to state the month Cranial Nerves: II: Visual  Fields are full to orienting to visual stimuli in all quadrants. Pupils are equal, round, and reactive to light.  III,IV, VI: EOMI V: Facial sensation is symmetric to light eyelash brush VII: Facial movement is symmetric.  VIII: hearing is intact to voice X:  Uvula elevates symmetrically XI: Shoulder shrug is symmetric. XII: tongue is midline without atrophy or fasciculations.  Motor: Tone is normal. Bulk is normal.  No pronator drift of the left upper extremity and no drift of the left lower extremity.  The right upper extremity had some variable weakness, at best drift without hitting the bed, the right lower extremity also had some variable weakness Sensory: Sensation is reduced in the right arm and leg. Deep Tendon Reflexes: 2+ and symmetric in the brachioradialis and patellae.  Plantars: Toes are mute bilaterally Cerebellar: FNF and HKS are intact on the left, within proportion to weakness on the right Gait:  Deferred for safety given weakness and TNK  NIHSS total 7 Score breakdown: One-point for answering questions and correctly, one-point for right lower extremity weakness, one-point for right upper extremity weakness, one-point for sensory loss on the right side, 2 points for severe aphasia, one-point for dysarthria Performed at time of patient arrival to ED    I have reviewed labs in epic and the results pertinent to this consultation are:  Basic Metabolic Panel: Recent Labs  Lab 02/13/22 2122  NA 136  K 4.1  CL 105  GLUCOSE 98  BUN 10  CREATININE 0.70    CBC: Recent Labs  Lab 02/13/22 2122  HGB 12.2  HCT 36.0    Coagulation Studies: Recent Labs    02/13/22 2131  LABPROT 12.6  INR 1.0      I have personally reviewed the images obtained: Head CT no acute intracranial process  CTA no LVO, but there is a small nonocclusive thrombus in the left internal carotid artery MRI brain with a scattered small infarcts in the left MCA distribution  Impression: Embolic stroke of undetermined source in a 51 year old woman with a past medical history significant for family history of blood clots and strokes concerning for potential heritable hypercoagulable state  # L MCA embolic stroke, currently undetermined source -  Stroke labs TSH, ESR, RPR, HIV, HgbA1c, fasting lipid panel - Hypercoag workup: AT3, beta-2-glycoprotein Ab, homocysteine, B12, MMA, factor V leiden, Prothrombin gene mutation, cardiolipin antibodies - MRI brain at 24 hours - Frequent neuro checks - Echocardiogram - Carotid dopplers - Hold antiplatelets and DVT ppx pending 24 hour imaging - Risk factor modification - Telemetry monitoring; event monitor vs. Loop recorder if workup otherwise unrevealing - Blood pressure goal   - Post tPA for 24  hours < 180/105 - PT consult, OT consult, Speech consult, unless patient is back to baseline - Admitted to stroke service   Lesleigh Noe MD-PhD [facility] [phone] Available 7 PM to 7 AM, outside of these hours please call Neurologist on call as listed on Amion.  CRITICAL CARE Performed by: Lorenza Chick   Total critical care time: 50 minutes  Critical care time was exclusive of separately billable procedures and treating other patients.  Critical care was necessary to treat or prevent imminent or life-threatening deterioration.  Critical care was time spent personally by me on the following activities: development of treatment plan with patient and/or surrogate as well as nursing, discussions with consultants, evaluation of patient's response to treatment, examination of patient, obtaining history from patient or surrogate, ordering and performing treatments and interventions,  ordering and review of laboratory studies, ordering and review of radiographic studies, pulse oximetry and re-evaluation of patient's condition.

## 2022-02-14 ENCOUNTER — Inpatient Hospital Stay (HOSPITAL_COMMUNITY): Payer: No Typology Code available for payment source

## 2022-02-14 DIAGNOSIS — I639 Cerebral infarction, unspecified: Secondary | ICD-10-CM

## 2022-02-14 DIAGNOSIS — I6522 Occlusion and stenosis of left carotid artery: Secondary | ICD-10-CM

## 2022-02-14 DIAGNOSIS — Z9282 Status post administration of tPA (rtPA) in a different facility within the last 24 hours prior to admission to current facility: Secondary | ICD-10-CM | POA: Diagnosis not present

## 2022-02-14 DIAGNOSIS — I63512 Cerebral infarction due to unspecified occlusion or stenosis of left middle cerebral artery: Secondary | ICD-10-CM

## 2022-02-14 DIAGNOSIS — Z8669 Personal history of other diseases of the nervous system and sense organs: Secondary | ICD-10-CM | POA: Diagnosis not present

## 2022-02-14 LAB — ECHOCARDIOGRAM COMPLETE
Area-P 1/2: 3.63 cm2
S' Lateral: 2.1 cm
Weight: 2806.4 oz

## 2022-02-14 LAB — MRSA NEXT GEN BY PCR, NASAL: MRSA by PCR Next Gen: NOT DETECTED

## 2022-02-14 LAB — RAPID URINE DRUG SCREEN, HOSP PERFORMED
Amphetamines: NOT DETECTED
Barbiturates: NOT DETECTED
Benzodiazepines: NOT DETECTED
Cocaine: NOT DETECTED
Opiates: NOT DETECTED
Tetrahydrocannabinol: NOT DETECTED

## 2022-02-14 LAB — TSH: TSH: 0.733 u[IU]/mL (ref 0.350–4.500)

## 2022-02-14 LAB — VITAMIN B12: Vitamin B-12: 230 pg/mL (ref 180–914)

## 2022-02-14 LAB — LIPID PANEL
Cholesterol: 203 mg/dL — ABNORMAL HIGH (ref 0–200)
HDL: 55 mg/dL (ref >40)
LDL Cholesterol: 136 mg/dL — ABNORMAL HIGH (ref 0–99)
Total CHOL/HDL Ratio: 3.7 RATIO
Triglycerides: 58 mg/dL (ref <150)
VLDL: 12 mg/dL (ref 0–40)

## 2022-02-14 LAB — RPR: RPR Ser Ql: NONREACTIVE

## 2022-02-14 LAB — HIV ANTIBODY (ROUTINE TESTING W REFLEX): HIV Screen 4th Generation wRfx: NONREACTIVE

## 2022-02-14 LAB — SEDIMENTATION RATE: Sed Rate: 39 mm/hr — ABNORMAL HIGH (ref 0–22)

## 2022-02-14 LAB — ANTITHROMBIN III: AntiThromb III Func: 99 % (ref 75–120)

## 2022-02-14 MED ORDER — PANTOPRAZOLE SODIUM 40 MG PO TBEC
40.0000 mg | DELAYED_RELEASE_TABLET | Freq: Every day | ORAL | Status: DC
  Administered 2022-02-15 – 2022-02-17 (×3): 40 mg via ORAL
  Filled 2022-02-14 (×3): qty 1

## 2022-02-14 MED ORDER — BUTALBITAL-APAP-CAFFEINE 50-325-40 MG PO TABS
1.0000 | ORAL_TABLET | Freq: Three times a day (TID) | ORAL | Status: DC | PRN
  Administered 2022-02-14: 1 via ORAL
  Filled 2022-02-14: qty 1

## 2022-02-14 MED ORDER — ROSUVASTATIN CALCIUM 20 MG PO TABS
20.0000 mg | ORAL_TABLET | Freq: Every day | ORAL | Status: DC
  Administered 2022-02-14 – 2022-02-17 (×4): 20 mg via ORAL
  Filled 2022-02-14 (×4): qty 1

## 2022-02-14 MED ORDER — VALPROATE SODIUM 100 MG/ML IV SOLN
1000.0000 mg | Freq: Once | INTRAVENOUS | Status: AC
  Administered 2022-02-14: 1000 mg via INTRAVENOUS
  Filled 2022-02-14: qty 10

## 2022-02-14 NOTE — Progress Notes (Addendum)
STROKE TEAM PROGRESS NOTE   INTERVAL HISTORY Patient is seen in her room with one family member at the bedside.  Yesterday, she experienced sudden onset right arm weakness and aphasia.  She was evaluated in the ED and given TNK to treat possible stroke.  Symptoms have improved, and MRI reveals punctate infarcts in left frontal and parietal lobes.  She does c/o headache, so Fioricet was ordered.  Vitals:   02/14/22 0800 02/14/22 0900 02/14/22 1000 02/14/22 1200  BP: (!) 88/64 (!) 83/63 95/66   Pulse: 67 (!) 59 71   Resp: 16 16 (!) 21   Temp: 98.1 F (36.7 C)   98.1 F (36.7 C)  TempSrc: Oral   Oral  SpO2: 99% 98% 100%   Weight:       CBC:  Recent Labs  Lab 02/13/22 2122 02/13/22 2131  WBC  --  10.5  NEUTROABS  --  6.1  HGB 12.2 9.6*  HCT 36.0 32.3*  MCV  --  68.4*  PLT  --  [id number]*   Basic Metabolic Panel:  Recent Labs  Lab 02/13/22 2122 02/13/22 2131  NA 136 135  K 4.1 3.8  CL 105 103  CO2  --  21*  GLUCOSE 98 141*  BUN 10 9  CREATININE 0.70 0.87  CALCIUM  --  9.3   Lipid Panel:  Recent Labs  Lab 02/14/22 0304  CHOL 203*  TRIG 58  HDL 55  CHOLHDL 3.7  VLDL 12  LDLCALC 136*   HgbA1c:  Recent Labs  Lab 02/13/22 2131  HGBA1C 5.6   Urine Drug Screen: No results for input(s): LABOPIA, COCAINSCRNUR, LABBENZ, AMPHETMU, THCU, LABBARB in the last 168 hours.  Alcohol Level  Recent Labs  Lab 02/13/22 2131  ETH <10    IMAGING past 24 hours MR BRAIN WO CONTRAST  Result Date: 02/13/2022 CLINICAL DATA:  Right facial droop, right-sided weakness EXAM: MRI HEAD WITHOUT CONTRAST TECHNIQUE: Multiplanar, multiecho pulse sequences of the brain and surrounding structures were obtained without intravenous contrast. COMPARISON:  No prior MRI, correlation is made with 02/13/2022 CT head and CTA head neck FINDINGS: Evaluation is somewhat limited by early termination of the study. No axial T1 sequence was obtained. Brain: Punctate foci of restricted diffusion in the left  parietal lobe (series 5, images 79-80) and left frontal lobe (series 5, images 81-85 and 88). No acute hemorrhage, mass, mass effect, or midline shift. No hydrocephalus or extra-axial collection. Scattered T2 hyperintense signal in the periventricular white matter, likely the sequela of chronic small vessel ischemic disease. Vascular: Normal flow voids. Skull and upper cervical spine: Normal marrow signal. Sinuses/Orbits: Mucosal thickening in the left frontal sinus, ethmoid air cells, and maxillary sinuses. Other: The mastoids are well aerated. 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 Electronically Signed   By: Merilyn Baba M.D.   On: 02/13/2022 23:00   ECHOCARDIOGRAM COMPLETE  Result Date: 02/14/2022    ECHOCARDIOGRAM REPORT   Patient Name:   Erika Jordan Date of Exam: 02/14/2022 Medical Rec #:  [id number]    Height: Accession #:    [id number]   Weight:       175.4 lb Date of Birth:  11-16-1970    BSA:          1.850 m Patient Age:    51 years     BP:           111/64 mmHg Patient Gender: F  HR:           62 bpm. Exam Location:  Inpatient Procedure: 2D Echo, Cardiac Doppler and Color Doppler Indications:    Stroke I63.9  History:        Patient has no prior history of Echocardiogram examinations.  Sonographer:    Darlina Sicilian RDCS Referring Phys: [id number] [place]  1. Left ventricular ejection fraction, by estimation, is 60 to 65%. The left ventricle has normal function. The left ventricle has no regional wall motion abnormalities. Left ventricular diastolic parameters were normal.  2. Right ventricular systolic function is normal. The right ventricular size is normal.  3. The mitral valve is normal in structure. Trivial mitral valve regurgitation. No evidence of mitral stenosis.  4. The aortic valve is tricuspid. Aortic valve regurgitation is not visualized. No aortic stenosis is present.  5. The inferior vena cava is normal in size with  greater than 50% respiratory variability, suggesting right atrial pressure of 3 mmHg. Comparison(s): No prior Echocardiogram. FINDINGS  Left Ventricle: Left ventricular ejection fraction, by estimation, is 60 to 65%. The left ventricle has normal function. The left ventricle has no regional wall motion abnormalities. The left ventricular internal cavity size was normal in size. There is  no left ventricular hypertrophy. Left ventricular diastolic parameters were normal. Right Ventricle: The right ventricular size is normal. Right ventricular systolic function is normal. Left Atrium: Left atrial size was normal in size. Right Atrium: Right atrial size was normal in size. Pericardium: There is no evidence of pericardial effusion. Mitral Valve: The mitral valve is normal in structure. Trivial mitral valve regurgitation. No evidence of mitral valve stenosis. Tricuspid Valve: The tricuspid valve is normal in structure. Tricuspid valve regurgitation is trivial. No evidence of tricuspid stenosis. Aortic Valve: The aortic valve is tricuspid. Aortic valve regurgitation is not visualized. No aortic stenosis is present. Pulmonic Valve: The pulmonic valve was not well visualized. Pulmonic valve regurgitation is trivial. No evidence of pulmonic stenosis. Aorta: The aortic root is normal in size and structure. Venous: The inferior vena cava is normal in size with greater than 50% respiratory variability, suggesting right atrial pressure of 3 mmHg. IAS/Shunts: The interatrial septum was not well visualized.  LEFT VENTRICLE PLAX 2D LVIDd:         4.60 cm   Diastology LVIDs:         2.10 cm   LV e' medial:    8.76 cm/s LV PW:         0.70 cm   LV E/e' medial:  10.9 LV IVS:        0.70 cm   LV e' lateral:   12.70 cm/s LVOT diam:     1.70 cm   LV E/e' lateral: 7.5 LV SV:         43 LV SV Index:   23 LVOT Area:     2.27 cm  RIGHT VENTRICLE RV S prime:     11.60 cm/s TAPSE (M-mode): 1.7 cm LEFT ATRIUM             Index        RIGHT  ATRIUM          Index LA diam:        3.00 cm 1.62 cm/m   RA Area:     8.81 cm LA Vol (A2C):   27.1 ml 14.65 ml/m  RA Volume:   15.50 ml 8.38 ml/m LA Vol (A4C):   32.9 ml 17.78 ml/m  LA Biplane Vol: 31.6 ml 17.08 ml/m  AORTIC VALVE LVOT Vmax:   94.20 cm/s LVOT Vmean:  60.600 cm/s LVOT VTI:    0.188 m  AORTA Ao Root diam: 2.70 cm MITRAL VALVE MV Area (PHT): 3.63 cm    SHUNTS MV Decel Time: 209 msec    Systemic VTI:  0.19 m MV E velocity: 95.40 cm/s  Systemic Diam: 1.70 cm MV A velocity: 61.80 cm/s MV E/A ratio:  1.54 Kirk Ruths MD Electronically signed by Kirk Ruths MD Signature Date/Time: 02/14/2022/12:24:48 PM    Final    CT HEAD CODE STROKE WO CONTRAST  Result Date: 02/13/2022 CLINICAL DATA:  Code stroke.  Stroke suspected EXAM: CT HEAD WITHOUT CONTRAST TECHNIQUE: Contiguous axial images were obtained from the base of the skull through the vertex without intravenous contrast. RADIATION DOSE REDUCTION: This exam was performed according to the departmental dose-optimization program which includes automated exposure control, adjustment of the mA and/or kV according to patient size and/or use of iterative reconstruction technique. COMPARISON:  None Available. FINDINGS: Brain: No evidence of acute infarction, hemorrhage, cerebral edema, mass, mass effect, or midline shift. Ventricles and sulci are normal for age. No extra-axial fluid collection. Vascular: No hyperdense vessel. Skull: Negative for fracture or focal lesion. Sinuses/Orbits: Mucosal thickening in the maxillary sinuses, ethmoid air cells, and left frontal sinus. The orbits are unremarkable. Other: The mastoid air cells are well aerated. ASPECTS [facility] Stroke Program Early CT Score) - Ganglionic level infarction (caudate, lentiform nuclei, internal capsule, insula, M1-M3 cortex): 7 - Supraganglionic infarction (M4-M6 cortex): 3 Total score (0-10 with 10 being normal): 10 IMPRESSION: 1. No acute intracranial process. 2. ASPECTS is 10 Code  stroke imaging results were communicated on 02/13/2022 at 9:27 pm to provider BHAGAT via secure text paging. Electronically Signed   By: Merilyn Baba M.D.   On: 02/13/2022 21:28   VAS [place] LOWER EXTREMITY VENOUS (DVT)  Result Date: 02/14/2022  Lower Venous DVT Study Patient Name:  LASHIYA PENADO  Date of Exam:   02/14/2022 Medical Rec #: [id number]     Accession #:    [id number] Date of Birth: [birth year]     Patient Gender: F Patient Age:   84 years Exam Location:  [facility] Procedure:      VAS [place] LOWER EXTREMITY VENOUS (DVT) Referring Phys: Cornelius Moras [place] Sokolow --------------------------------------------------------------------------------  Indications: Embolic stroke.  Comparison Study: No prior studies. Performing Technologist: Darlin Coco RDMS, RVT  Examination Guidelines: A complete evaluation includes B-mode imaging, spectral Doppler, color Doppler, and power Doppler as needed of all accessible portions of each vessel. Bilateral testing is considered an integral part of a complete examination. Limited examinations for reoccurring indications may be performed as noted. The reflux portion of the exam is performed with the patient in reverse Trendelenburg.  +---------+---------------+---------+-----------+----------+--------------+ RIGHT    CompressibilityPhasicitySpontaneityPropertiesThrombus Aging +---------+---------------+---------+-----------+----------+--------------+ CFV      Full           Yes                                          +---------+---------------+---------+-----------+----------+--------------+ SFJ      Full                                                        +---------+---------------+---------+-----------+----------+--------------+  FV Prox  Full                                                        +---------+---------------+---------+-----------+----------+--------------+ FV Mid   Full                                                         +---------+---------------+---------+-----------+----------+--------------+ FV DistalFull                                                        +---------+---------------+---------+-----------+----------+--------------+ PFV      Full                                                        +---------+---------------+---------+-----------+----------+--------------+ POP      Full           Yes      Yes                                 +---------+---------------+---------+-----------+----------+--------------+ PTV      Full                                                        +---------+---------------+---------+-----------+----------+--------------+ PERO     Full                                                        +---------+---------------+---------+-----------+----------+--------------+ Gastroc  Full                                                        +---------+---------------+---------+-----------+----------+--------------+   +---------+---------------+---------+-----------+----------+--------------+ LEFT     CompressibilityPhasicitySpontaneityPropertiesThrombus Aging +---------+---------------+---------+-----------+----------+--------------+ CFV      Full           Yes      Yes                                 +---------+---------------+---------+-----------+----------+--------------+ SFJ      Full                                                        +---------+---------------+---------+-----------+----------+--------------+  FV Prox  Full                                                        +---------+---------------+---------+-----------+----------+--------------+ FV Mid   Full                                                        +---------+---------------+---------+-----------+----------+--------------+ FV DistalFull                                                         +---------+---------------+---------+-----------+----------+--------------+ PFV      Full                                                        +---------+---------------+---------+-----------+----------+--------------+ POP      Full           Yes      Yes                                 +---------+---------------+---------+-----------+----------+--------------+ PTV      Full                                                        +---------+---------------+---------+-----------+----------+--------------+ PERO     Full                                                        +---------+---------------+---------+-----------+----------+--------------+ Gastroc  Full                                                        +---------+---------------+---------+-----------+----------+--------------+     Summary: RIGHT: - There is no evidence of deep vein thrombosis in the lower extremity.  - No cystic structure found in the popliteal fossa.  LEFT: - There is no evidence of deep vein thrombosis in the lower extremity.  - No cystic structure found in the popliteal fossa.  *See table(s) above for measurements and observations. Electronically signed by Servando Snare MD on 02/14/2022 at 12:57:14 PM.    Final    CT ANGIO HEAD NECK W WO CM W PERF (CODE STROKE)  Result Date: 02/13/2022 CLINICAL DATA:  Right facial droop, right-sided weakness, stroke suspected EXAM: CT ANGIOGRAPHY HEAD AND NECK CT PERFUSION TECHNIQUE: Multidetector CT  imaging of the head and neck was performed using the standard protocol during bolus administration of intravenous contrast. Multiplanar CT image reconstructions and MIPs were obtained to evaluate the vascular anatomy. Carotid stenosis measurements (when applicable) are obtained utilizing NASCET criteria, using the distal internal carotid diameter as the denominator. Multiphase CT imaging of the brain was performed following IV bolus contrast injection. Subsequent  parametric perfusion maps were calculated using RAPID software. RADIATION DOSE REDUCTION: This exam was performed according to the departmental dose-optimization program which includes automated exposure control, adjustment of the mA and/or kV according to patient size and/or use of iterative reconstruction technique. CONTRAST:  100 mL Omnipaque 350 COMPARISON:  No prior CTA, correlation is made with CT head 02/13/2022 FINDINGS: CT HEAD FINDINGS For noncontrast findings, please see same day CT head. CTA NECK FINDINGS Aortic arch: Standard branching. Imaged portion shows no evidence of aneurysm or dissection. No significant stenosis of the major arch vessel origins. Right carotid system: No evidence of dissection, occlusion, or hemodynamically significant stenosis (greater than 50%). Left carotid system: Filling defect along the medial aspect of the proximal left ICA, extending into the lumen (series 7, images 202-204), which may represent intraluminal thrombus. No evidence of dissection, occlusion, or hemodynamically significant stenosis (greater than 50%). Vertebral arteries: No evidence of dissection, occlusion, or hemodynamically significant stenosis (greater than 50%). Skeleton: No acute osseous abnormality. Other neck: No acute finding. Upper chest: No focal pulmonary opacity or pleural effusion. Review of the MIP images confirms the above findings CTA HEAD FINDINGS Anterior circulation: Both internal carotid arteries are patent to the termini, without significant stenosis. A1 segments patent. Normal anterior communicating artery. Anterior cerebral arteries are patent to their distal aspects. No M1 stenosis or occlusion, with irregularity of the left-greater-than-right M1 normal MCA bifurcations. Distal MCA branches perfused and symmetric. Posterior circulation: Vertebral arteries patent to the vertebrobasilar junction without stenosis. Posterior inferior cerebellar arteries patent proximally. Basilar patent to  its distal aspect. Superior cerebellar arteries patent proximally. Patent P1 segments. PCAs perfused to their distal aspects without stenosis. The right posterior communicating artery is patent. Venous sinuses: As permitted by contrast timing, patent. Anatomic variants: None significant. Review of the MIP images confirms the above findings CT Brain Perfusion Findings: ASPECTS: 10 CBF (<30%) Volume: 37mL Perfusion (Tmax>6.0s) volume: 33mL Mismatch Volume: 41mL Infarction Location:None IMPRESSION: 1. Filling defect in the proximal left ICA, concerning for intraluminal thrombus. No hemodynamically significant stenosis in the neck. 2.  No intracranial large vessel occlusion or significant stenosis. 3. No infarct core or penumbra on CT perfusion. Code stroke imaging results were communicated on 02/13/2022 at 10:06 pm to provider BHAGAT via secure text paging. Electronically Signed   By: Merilyn Baba M.D.   On: 02/13/2022 22:07    PHYSICAL EXAM General:  Alert, well-developed, well-nourished patient in no acute distress Respiratory:  Regular, unlabored respirations on room air  NEURO:  Mental Status: AA&Ox3  Speech/Language: speech is without dysarthria or aphasia.  Repetition, fluency, and comprehension intact.  Cranial Nerves:  II: PERRL. Visual fields full.  III, IV, VI: EOMI. Eyelids elevate symmetrically.  V: Sensation is intact to light touch and symmetrical to face.  VII: Smile is symmetrical.   VIII: hearing intact to voice. IX, X: Phonation is normal.  [id number] shrug 5/5. XII: tongue is midline without fasciculations. Motor: 5/5 strength to all muscle groups tested.  Tone: is normal and bulk is normal Sensation- Intact to light touch bilaterally.  Coordination: FTN intact bilaterally.No drift.  Gait- deferred  ASSESSMENT/PLAN Erika Jordan is a 51 y.o. female with history of migraines presenting with sudden onset right arm weakness and aphasia.  She was evaluated in the ED and given  TNK to treat possible stroke.  Symptoms have improved, and MRI reveals punctate infarcts in left frontal and parietal lobes.  She does c/o headache, so fioricet was ordered.  CTA shows possible thrombus in left ICA, will repeat soon to determine if this was artifact or thrombus.  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 ASPECTS 10.    CTA head & neck filling defect in left ICA concerning for thrombus vs artifact, no LVO or hemodynamically significant stenosis CT perfusion no core or penumbra MRI  punctate infarcts in left frontal and parietal lobes MRI repeat pending CTA neck repeat pending 2D Echo EF 60-65%, interatrial septum not well visualized LE venous Doppler no evidence of DVT  Consider TEE to rule out cardiac embolic source LDL [id number] 5.6 UDS negative Hypercoagulable workup pending VTE prophylaxis - SCDs No antithrombotic prior to admission, now on No antithrombotic as she is <24 hours from TNK administration Therapy recommendations:  pending Disposition:  pending   Hypertension Home meds:  none Stable Keep BP <180/105 Long-term BP goal normotensive  Hyperlipidemia Home meds:  none LDL 136, goal < 70 Add rosuvastatin 20 mg daily  Continue statin at discharge   Other Stroke Risk Factors Migraines, 3-4 times per months, lasting 2 days, with nauseous and photophobia, phonophobia - PRN Fioricet.   Left Bell's palsy in 1990s  Other Active Problems Microcytic anemia, hemoglobin 9.6, check iron, TIBC and ferritin level  Hospital day # [place] , MSN, AGACNP-BC [facility] See Amion for schedule and pager information 02/14/2022 1:22 PM   ATTENDING NOTE: I reviewed above note and agree with the assessment and plan. Pt was seen and examined.   51 year old female with history of migraine and left Bell's palsy admitted for  headache, right-sided weakness, speech difficulty, confusion.  CT no acute abnormality, status post TNK.  CTA head and neck no LVO but left ICA proximal intraluminal thrombus versus artifact.  MRI showed left MCA/ACA scattered infarcts.  Pending repeat MRI and repeat CTA neck.  EF 60 to 65%, DVT negative.  Will consider TEE.  LDL 136, A1c 5.6, hypercoag work-up pending.  UDS negative.  Creatinine 0.87, hemoglobin 9.6.  On exam, patient husband at bedside, patient lying in bed, mild lethargic however neurologically intact no focal deficit.  Etiology for patient stroke concerning for left ICA thrombus however etiology unclear.  Will repeat CTA neck to rule out artifact.  Plan for TEE in a.m.  Repeat MRI at 24 hours after TNK to rule out bleeding.  If negative, will start antiplatelet.  Consider anticoagulation if ICA thrombus confirmed on repeat CTA neck.  Continue Crestor 20, PT/OT no recommendation.  For detailed assessment and plan, please refer to above as I have made changes wherever appropriate.   Rosalin Hawking, MD PhD Stroke Neurology 02/14/2022 6:49 PM  This patient is critically ill due to left MCA stroke, ICA thrombus, status post TNK and at significant risk of neurological worsening, death form recurrent stroke, hemorrhagic transformation, bleeding from TNK. This patient's care requires constant monitoring of vital signs, hemodynamics, respiratory and cardiac monitoring, review of multiple databases, neurological assessment, discussion with family, other specialists and medical decision making of high  complexity. I spent 40 minutes of neurocritical care time in the care of this patient. I had long discussion with patient and husband at bedside, updated pt current condition, treatment plan and potential prognosis, and answered all the questions.  They expressed understanding and appreciation.       To contact Stroke Continuity provider, please refer to [email or web address] After hours, contact General  Neurology

## 2022-02-14 NOTE — Progress Notes (Signed)
Lower extremity venous bilateral study completed.   Please see CV Proc for preliminary results.   Clovia Reine, RDMS, RVT  

## 2022-02-14 NOTE — TOC CAGE-AID Note (Signed)
Transition of Care [facility]) - CAGE-AID Screening   Patient Details  Name: Erika Jordan MRN: [id number] Date of Birth: Mar 10, 1971  [facility] of [facility]) CM/SW Contact:    Erika Jordan, LCSWA Phone Number: 02/14/2022, 1:52 PM   Clinical Narrative: Pt participated in [place].  Pt stated she does not use substance or ETOH.  Pt was not offered resources, due to no usage of substance or ETOH.    Erika Jordan, MSW, LCSW-A Pronouns:  She/Her/Hers [facility] HealthTransitions of Care Clinical Social Worker Direct Number:  [phone] [email or web address]  CAGE-AID Screening:    Have You Ever Felt You Ought to Cut Down on Your Drinking or Drug Use?: No Have People Annoyed You By [employer] Your Drinking Or Drug Use?: No Have You Felt Bad Or Guilty About Your Drinking Or Drug Use?: No Have You Ever Had a Drink or Used Drugs First Thing In The Morning to Steady Your Nerves or to Get Rid of a Hangover?: No CAGE-AID Score: 0  Substance Abuse Education Offered: No

## 2022-02-14 NOTE — Evaluation (Signed)
Physical Therapy Evaluation Patient Details Name: Erika Jordan MRN: [id number] DOB: 06-Feb-1971 Today's Date: 02/14/2022  History of Present Illness  [name] is a 51 y.o. female with history of migraines admitted right arm weakness and aphasia.  She was evaluated in the ED and given TNK to treat possible stroke.  Symptoms have improved, and MRI reveals punctate infarcts in left frontal and parietal lobes.  She does c/o headache, so fioricet was ordered.  CTA shows possible thrombus in left ICA, plans for follow up testing.  Clinical Impression  Patient presents with decreased mobility due to bedrest, headache and imbalance.  Previous history of hitting her head during fall 1.5 years ago and has had vertigo ever since.  Reports going to outpatient PT for it in the past.  Today able to walk hallway with min A with hand hold assist due to imbalance and short shuffling steps.  Patient will benefit from skilled PT in the acute setting prior to d/c home with family support.  Feel if follow up is needed pt will ask for referral from primary care physician in her hometown.       Recommendations for follow up therapy are one component of a multi-disciplinary discharge planning process, led by the attending physician.  Recommendations may be updated based on patient status, additional functional criteria and insurance authorization.  Follow Up Recommendations No PT follow up    Assistance Recommended at Discharge Intermittent Supervision/Assistance  Patient can return home with the following  A little help with walking and/or transfers;Assistance with cooking/housework;Assist for transportation    Equipment Recommendations None recommended by PT  Recommendations for Other Services       Functional Status Assessment Patient has had a recent decline in their functional status and demonstrates the ability to make significant improvements in function in a reasonable and predictable amount of time.      Precautions / Restrictions Precautions Precautions: Fall      Mobility  Bed Mobility Overal bed mobility: Needs Assistance Bed Mobility: Supine to Sit     Supine to sit: Supervision, HOB elevated     General bed mobility comments: slow and mildly effortful    Transfers Overall transfer level: Needs assistance   Transfers: Sit to/from Stand Sit to Stand: Min guard           General transfer comment: for balance    Ambulation/Gait Ambulation/Gait assistance: Min guard, Min assist Gait Distance (Feet): 150 Feet Assistive device: 1 person hand held assist Gait Pattern/deviations: Step-through pattern, Decreased stride length, Shuffle       General Gait Details: some instability, and slower pace with shorter stride halting due to imbalance, cues throughout for stationary target for visual compensation as pt with h/o vertigo  Stairs            Wheelchair Mobility    Modified Rankin (Stroke Patients Only) Modified Rankin (Stroke Patients Only) Pre-Morbid Rankin Score: No significant disability Modified Rankin: Moderately severe disability     Balance Overall balance assessment: Needs assistance   Sitting balance-Leahy Scale: Good       Standing balance-Leahy Scale: Fair Standing balance comment: can stand unsupported, unsteady with ambulation needing HHA                             Pertinent Vitals/Pain Pain Assessment Pain Score: 7  Pain Descriptors / Indicators: Headache Pain Intervention(s): Monitored during session, Premedicated before session    Home Living  Family/patient expects to be discharged to:: Private residence Living Arrangements: Children;Spouse/significant other Available Help at Discharge: Family Type of Home: House Home Access: Stairs to enter   Technical brewer of Steps: 1 Alternate Level Stairs-Number of Steps: flight Home Layout: Two level;Bed/bath upstairs Home Equipment: Conservation officer, nature (2  wheels);BSC/3in1 Additional Comments: spouse had recent THA    Prior Function Prior Level of Function : Independent/Modified Independent             Mobility Comments: she lives in [place], [place], here to visit daughter who recently had twins       Hand Dominance   Dominant Hand: Left    Extremity/Trunk Assessment   Upper Extremity Assessment Upper Extremity Assessment: Defer to OT evaluation    Lower Extremity Assessment Lower Extremity Assessment: Overall WFL for tasks assessed       Communication   Communication: No difficulties  Cognition Arousal/Alertness: Awake/alert Behavior During Therapy: WFL for tasks assessed/performed Overall Cognitive Status: Within Functional Limits for tasks assessed                                          General Comments General comments (skin integrity, edema, etc.): VSS on RA, noted L facial droop, pt relates from 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 and at times with dysarthria and aphasia during migraine episodes    Exercises     Assessment/Plan    PT Assessment Patient needs continued PT services  PT Problem List Decreased mobility;Decreased balance;Decreased knowledge of use of DME       PT Treatment Interventions DME instruction;Therapeutic activities;Therapeutic exercise;Patient/family education;Gait training;Stair training;Functional mobility training    PT Goals (Current goals can be found in the Care Plan section)  Acute Rehab PT Goals Patient Stated Goal: To return to independent PT Goal Formulation: With patient Time For Goal Achievement: 02/28/22 Potential to Achieve Goals: Good    Frequency Min 4X/week     Co-evaluation               AM-PAC PT "6 Clicks" Mobility  Outcome Measure Help needed turning from your back to your side while in a flat bed without using bedrails?: None Help needed moving from lying on your back to sitting on the side of a flat  bed without using bedrails?: None Help needed moving to and from a bed to a chair (including a wheelchair)?: None Help needed standing up from a chair using your arms (e.g., wheelchair or bedside chair)?: A Little Help needed to walk in hospital room?: A Little Help needed climbing 3-5 steps with a railing? : Total 6 Click Score: 19    End of Session Equipment Utilized During Treatment: Gait belt Activity Tolerance: Patient limited by pain Patient left: in chair;with call bell/phone within reach;Other (comment) (left with OT in the room)   PT Visit Diagnosis: Other abnormalities of gait and mobility (R26.89)    Time: [id number] PT Time Calculation (min) (ACUTE ONLY): 28 min   Charges:   PT Evaluation $PT Eval Moderate Complexity: 1 Mod          Cyndi Rangel Echeverri, PT Acute Rehabilitation Services [phone] Office:[phone] 02/14/2022   Reginia Naas 02/14/2022, 3:06 PM

## 2022-02-14 NOTE — Progress Notes (Signed)
PT Cancellation Note  Patient Details Name: Erika Jordan MRN: [id number] DOB: 1971/04/20   Cancelled Treatment:    Reason Eval/Treat Not Completed: Active bedrest order; will await updated activity orders prior to initiating PT evaluation.   Elray Mcgregor 02/14/2022, 9:17 AM Sheran Lawless, PT Acute Rehabilitation Services Pager:[phone] [phone] 02/14/2022

## 2022-02-14 NOTE — Progress Notes (Signed)
OT Cancellation Note  Patient Details Name: Keaira Whitehurst MRN: [id number] DOB: 1970/11/06   Cancelled Treatment:    Reason Eval/Treat Not Completed: Active bedrest order (will assess when activity orders updated.)  [facility] 02/14/2022, 8:44 AM Luisa Dago, OT/L   Acute OT Clinical Specialist Acute Rehabilitation Services Pager [phone] Office [phone]

## 2022-02-14 NOTE — Evaluation (Signed)
Occupational Therapy Evaluation Patient Details Name: Erika Jordan MRN: [id number] DOB: 03/09/1971 Today's Date: 02/14/2022   History of Present Illness Erika Jordan is a 51 y.o. female with history of migraines admitted right arm weakness and aphasia.  She was evaluated in the ED and given TNK to treat possible stroke.  Symptoms have improved, and MRI reveals punctate infarcts in left frontal and parietal lobes.  She does c/o headache, so fioricet was ordered.  CTA shows possible thrombus in left ICA, plans for follow up testing.   Clinical Impression   Pt with complaints of significant HA during assessment. Pt unsteady with mobility however does not demonstrate significant deficits. Using RUE WFL and does not endorse any visual changes. Pt feels she is close to baseline. Will follow up with further assessment of IADL tasks, however do not recommend OT follow up. Began education on warning signs/symptoms of CVA using [employer].      Recommendations for follow up therapy are one component of a multi-disciplinary discharge planning process, led by the attending physician.  Recommendations may be updated based on patient status, additional functional criteria and insurance authorization.   Follow Up Recommendations  No OT follow up    Assistance Recommended at Discharge Intermittent Supervision/Assistance  Patient can return home with the following Assistance with cooking/housework;Direct supervision/assist for medications management;Direct supervision/assist for financial management;Assist for transportation    Functional Status Assessment  Patient has had a recent decline in their functional status and demonstrates the ability to make significant improvements in function in a reasonable and predictable amount of time.  Equipment Recommendations  None recommended by OT    Recommendations for Other Services       Precautions / Restrictions Precautions Precautions: Fall      Mobility  Bed Mobility Overal bed mobility: Needs Assistance Bed Mobility: Supine to Sit     Supine to sit: Supervision, HOB elevated     General bed mobility comments: slow and mildly effortful    Transfers Overall transfer level: Needs assistance   Transfers: Sit to/from Stand Sit to Stand: Min guard           General transfer comment: for balance      Balance Overall balance assessment: Needs assistance   Sitting balance-Leahy Scale: Good       Standing balance-Leahy Scale: Fair Standing balance comment: can stand unsupported, unsteady with ambulation needing HHA                           ADL either performed or assessed with clinical judgement   ADL Overall ADL's : Needs assistance/impaired                                     Functional mobility during ADLs: Min guard General ADL Comments: set up for ADL tasks primarily due to mild unsteadiness adn HA; hx of vertigo     Vision Baseline Vision/History: 1 Wears glasses Vision Assessment?: No apparent visual deficits     Perception     Praxis      Pertinent Vitals/Pain Pain Assessment Pain Assessment: 0-10 Pain Score: 7  Pain Descriptors / Indicators: Headache Pain Intervention(s): Limited activity within patient's tolerance     Hand Dominance Left   Extremity/Trunk Assessment Upper Extremity Assessment Upper Extremity Assessment: Overall WFL for tasks assessed   Lower Extremity Assessment Lower Extremity Assessment: Defer to PT  evaluation   Cervical / Trunk Assessment Cervical / Trunk Assessment: Normal   Communication Communication Communication: Other (comment) (mild word finding deficits noted)   Cognition Arousal/Alertness: Awake/alert Behavior During Therapy: WFL for tasks assessed/performed Overall Cognitive Status: Within Functional Limits for tasks assessed (most likely close to baseline; slower processing however pt with HA at this time)                                        General Comments  VSS on RA    Exercises     Shoulder Instructions      Home Living Family/patient expects to be discharged to:: Private residence Living Arrangements: Children;Spouse/significant other Available Help at Discharge: Family Type of Home: House Home Access: Stairs to enter Secretary/administrator of Steps: 1   Home Layout: Two level;Bed/bath upstairs Alternate Level Stairs-Number of Steps: flight Alternate Level Stairs-Rails: Right Bathroom Shower/Tub: Chief Strategy Officer: Standard     Home Equipment: Agricultural consultant (2 wheels);BSC/3in1   Additional Comments: spouse had recent THA  Lives With: Spouse    Prior Functioning/Environment Prior Level of Function : Independent/Modified Independent             Mobility Comments: she lives in [place], [place], here to visit daughter who recently had twins          OT Problem List: Decreased activity tolerance;Impaired balance (sitting and/or standing);Decreased safety awareness;Decreased knowledge of use of DME or AE;Pain      OT Treatment/Interventions: Self-care/ADL training;DME and/or AE instruction;Therapeutic activities;Cognitive remediation/compensation;Patient/family education    OT Goals(Current goals can be found in the care plan section) Acute Rehab OT Goals Patient Stated Goal: to go home OT Goal Formulation: With patient Time For Goal Achievement: 02/28/22 Potential to Achieve Goals: Good  OT Frequency: Min 2X/week    Co-evaluation              AM-PAC OT "6 Clicks" Daily Activity     Outcome Measure Help from another person eating meals?: None Help from another person taking care of personal grooming?: A Little Help from another person toileting, which includes using toliet, bedpan, or urinal?: A Little Help from another person bathing (including washing, rinsing, drying)?: A Little Help from another person to put on and taking off regular  upper body clothing?: A Little Help from another person to put on and taking off regular lower body clothing?: A Little 6 Click Score: 19   End of Session Equipment Utilized During Treatment: Gait belt Nurse Communication: Mobility status  Activity Tolerance: Patient tolerated treatment well Patient left: in chair;with call bell/phone within reach;with chair alarm set  OT Visit Diagnosis: Unsteadiness on feet (R26.81);Pain Pain - part of body:  (head)                Time: 9485-4627 OT Time Calculation (min): 34 min Charges:  OT General Charges $OT Visit: 1 Visit OT Evaluation $OT Eval Moderate Complexity: 1 Mod  Azell Bill, OT/L   Acute OT Clinical Specialist Acute Rehabilitation Services Pager [phone] Office [phone]   [facility] 02/14/2022, 4:32 PM

## 2022-02-14 NOTE — Evaluation (Signed)
Speech Language Pathology Evaluation Patient Details Name: Erika Jordan MRN: [id number] DOB: [birth year] Today's Date: 02/14/2022 Time: 1014-1030 SLP Time Calculation (min) (ACUTE ONLY): 16 min  Problem List:  Patient Active Problem List   Diagnosis Date Noted   Acute ischemic left MCA stroke (HCC) 02/13/2022   Past Medical History: No past medical history on file.   HPI:  51 y.o. left-handed woman with a past medical history significant for migraine headaches (not on any medications), presented with headache with right-sided weakness and difficulty speaking/confusion. 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 Pt is from [place], [place], and is in [place] visiting her daughter.   Assessment / Plan / Recommendation Clinical Impression  Ms. Keeter participated in speech/language assessment - having signficant HA, so lights were dimmed and evaluation was limited. She demonstrated clear, fluent speech without dysarthria.  Output was slow and deliberate with mild word-retrieval difficulty at conversational level.  Naming to confrontation and responsive naming were WNL.  Divergent naming was mildly impaired. Comprehension was normal with preserved ability to follow multi-step commands, normal word discrimination and yes/no reliability.  Pt's spouse was at bedside, having just arrived from [facility] this am.  He felt that her communication was consistent with behaviors surrounding  her migraines.  Recommend SLP f/u for ongoing/deeper assessment in the next few days. Pt agrees with plan.     SLP Assessment  SLP Recommendation/Assessment: Patient needs continued Speech Lanaguage Pathology Services SLP Visit Diagnosis: Aphasia (R47.01)    Recommendations for follow up therapy are one component of a multi-disciplinary discharge planning process, led by the attending physician.  Recommendations may be updated based on patient status, additional functional criteria and  insurance authorization.    Follow Up Recommendations  Other (comment) (tba)    Assistance Recommended at Discharge   tba      Frequency and Duration min 2x/week  1 week      SLP Evaluation Cognition  Overall Cognitive Status: Within Functional Limits for tasks assessed Arousal/Alertness: Awake/alert Orientation Level: Oriented X4 Attention: Sustained Sustained Attention: Appears intact Memory: Appears intact       Comprehension  Auditory Comprehension Overall Auditory Comprehension: Appears within functional limits for tasks assessed Yes/No Questions: Within Functional Limits Commands: Within Functional Limits Visual Recognition/Discrimination Discrimination: Within Function Limits    Expression Expression Primary Mode of Expression: Verbal Verbal Expression Overall Verbal Expression: Impaired Automatic Speech: Day of week;Month of year Level of Generative/Spontaneous Verbalization: Conversation Repetition: No impairment Naming: Impairment Responsive: 76-100% accurate Confrontation: Within functional limits Divergent: 75-100% accurate Pragmatics: No impairment Written Expression Dominant Hand: Left   Oral / Motor  Oral Motor/Sensory Function Overall Oral Motor/Sensory Function: Within functional limits Motor Speech Overall Motor Speech: Appears within functional limits for tasks assessed           Shannan Slinker L. Samson Frederic, MA CCC/SLP Acute Rehabilitation Services Office number [phone] Pager [phone]  Blenda Mounts Laurice 02/14/2022, 10:44 AM

## 2022-02-14 NOTE — H&P (Signed)
Please see consult note written by myself

## 2022-02-14 NOTE — Progress Notes (Signed)
  Transition of Care [facility]) Screening Note   Patient Details  Name: Erika Jordan Date of Birth: 03-Apr-1971   Transition of Care [facility]) CM/SW Contact:    Harriet Masson, RN Phone Number: 02/14/2022, 12:58 PM    Transition of Care Department [facility]) has reviewed patient and no TOC needs have been identified at this time. We will continue to monitor patient advancement through interdisciplinary progression rounds. If new patient transition needs arise, please place a TOC consult.

## 2022-02-14 NOTE — Progress Notes (Signed)
  Echocardiogram 2D Echocardiogram has been performed.  Leta Jungling M 02/14/2022, 12:06 PM

## 2022-02-15 ENCOUNTER — Inpatient Hospital Stay (HOSPITAL_COMMUNITY): Payer: No Typology Code available for payment source

## 2022-02-15 ENCOUNTER — Encounter (HOSPITAL_COMMUNITY)
Admission: EM | Disposition: A | Discharge status: Home / Self Care | Payer: Self-pay | Source: Home / Self Care | Attending: Neurology

## 2022-02-15 ENCOUNTER — Inpatient Hospital Stay (HOSPITAL_COMMUNITY): Payer: No Typology Code available for payment source | Admitting: Anesthesiology

## 2022-02-15 DIAGNOSIS — Z8673 Personal history of transient ischemic attack (TIA), and cerebral infarction without residual deficits: Secondary | ICD-10-CM

## 2022-02-15 DIAGNOSIS — I10 Essential (primary) hypertension: Secondary | ICD-10-CM

## 2022-02-15 DIAGNOSIS — I63232 Cerebral infarction due to unspecified occlusion or stenosis of left carotid arteries: Secondary | ICD-10-CM

## 2022-02-15 DIAGNOSIS — I6522 Occlusion and stenosis of left carotid artery: Secondary | ICD-10-CM | POA: Diagnosis present

## 2022-02-15 DIAGNOSIS — I63512 Cerebral infarction due to unspecified occlusion or stenosis of left middle cerebral artery: Secondary | ICD-10-CM | POA: Diagnosis not present

## 2022-02-15 HISTORY — PX: IR CT HEAD LTD: IMG2386

## 2022-02-15 HISTORY — PX: IR ANGIO VERTEBRAL SEL SUBCLAVIAN INNOMINATE UNI R MOD SED: IMG5365

## 2022-02-15 HISTORY — PX: IR INTRAVSC STENT CERV CAROTID W/EMB-PROT MOD SED INCL ANGIO: IMG2303

## 2022-02-15 HISTORY — PX: IR ANGIO INTRA EXTRACRAN SEL COM CAROTID INNOMINATE BILAT MOD SED: IMG5360

## 2022-02-15 HISTORY — PX: RADIOLOGY WITH ANESTHESIA: SHX6223

## 2022-02-15 LAB — BASIC METABOLIC PANEL
Anion gap: 7 (ref 5–15)
BUN: 7 mg/dL (ref 6–20)
CO2: 22 mmol/L (ref 22–32)
Calcium: 7.7 mg/dL — ABNORMAL LOW (ref 8.9–10.3)
Chloride: 107 mmol/L (ref 98–111)
Creatinine, Ser: 0.77 mg/dL (ref 0.44–1.00)
GFR, Estimated: >60 mL/min (ref >60)
Glucose, Bld: 149 mg/dL — ABNORMAL HIGH (ref 70–99)
Potassium: 4.4 mmol/L (ref 3.5–5.1)
Sodium: 136 mmol/L (ref 135–145)

## 2022-02-15 LAB — CBC
HCT: 27.7 % — ABNORMAL LOW (ref 36.0–46.0)
Hemoglobin: 8 g/dL — ABNORMAL LOW (ref 12.0–15.0)
MCH: 20 pg — ABNORMAL LOW (ref 26.0–34.0)
MCHC: 28.9 g/dL — ABNORMAL LOW (ref 30.0–36.0)
MCV: 69.1 fL — ABNORMAL LOW (ref 80.0–100.0)
Platelets: 337 10*3/uL (ref 150–400)
RBC: 4.01 MIL/uL (ref 3.87–5.11)
RDW: 17.2 % — ABNORMAL HIGH (ref 11.5–15.5)
WBC: 11.6 10*3/uL — ABNORMAL HIGH (ref 4.0–10.5)
nRBC: 0 % (ref 0.0–0.2)

## 2022-02-15 LAB — IRON AND TIBC
Iron: 14 ug/dL — ABNORMAL LOW (ref 28–170)
Saturation Ratios: 3 % — ABNORMAL LOW (ref 10.4–31.8)
TIBC: 441 ug/dL (ref 250–450)
UIBC: 427 ug/dL

## 2022-02-15 LAB — ANA W/REFLEX IF POSITIVE: Anti Nuclear Antibody (ANA): NEGATIVE

## 2022-02-15 LAB — PHOSPHORUS: Phosphorus: 2.2 mg/dL — ABNORMAL LOW (ref 2.5–4.6)

## 2022-02-15 LAB — MAGNESIUM: Magnesium: 1.9 mg/dL (ref 1.7–2.4)

## 2022-02-15 LAB — FERRITIN: Ferritin: 5 ng/mL — ABNORMAL LOW (ref 11–307)

## 2022-02-15 IMAGING — CT CT ANGIO NECK
2 of 6 series · 15 of 46 positions shown, 17 images · IV contrast (APPLIED)
Comparison: Brain MRI today.  Recent CTA head and neck [DATE].

CLINICAL DATA: 51-year-old female status post code stroke
presentation on [DATE], evidence of left ICA thrombus on CTA.
Punctate left hemisphere infarcts on MRI at that time. Recurrent
right extremity symptoms, word finding difficulty.



[Series 5: cta neck 2.0 i30f 3 · axial · 0.45mm/px · z∈[-510,-310]mm · 12 of 112 slices shown, 14 images]
[im 6/112  soft-tissue]
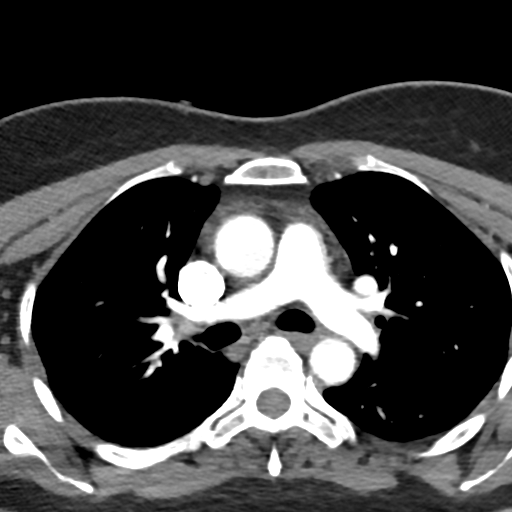
[im 6/112  bone]
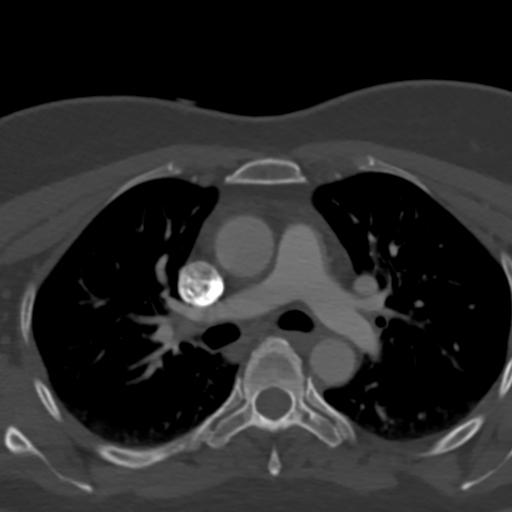
[im 16/112  soft-tissue]
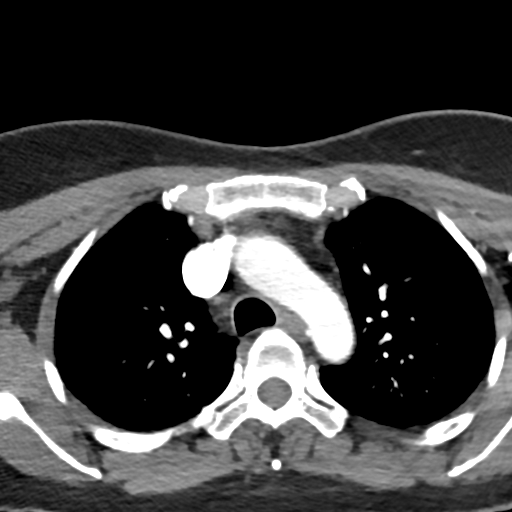
[im 27/112  soft-tissue]
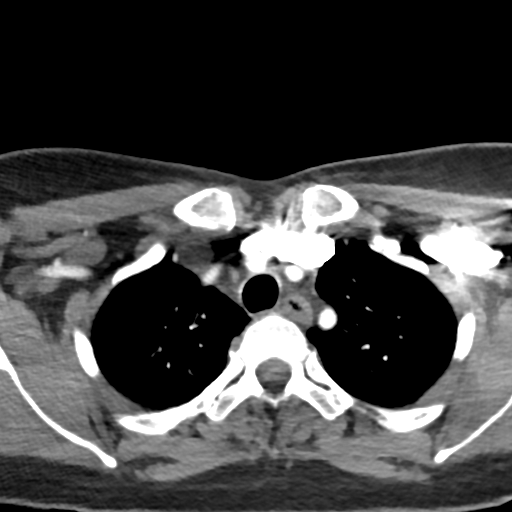
[im 32/112  soft-tissue]
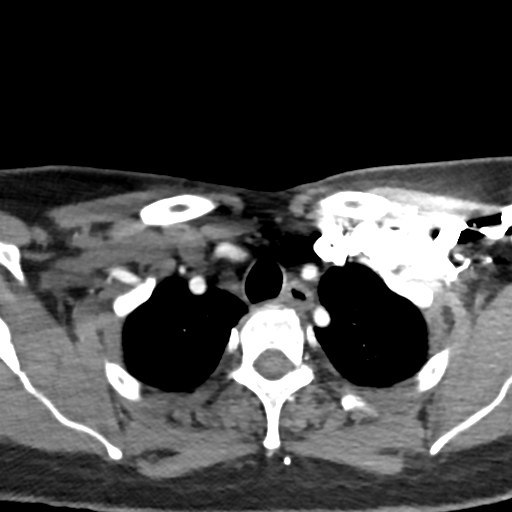
[im 43/112  soft-tissue]
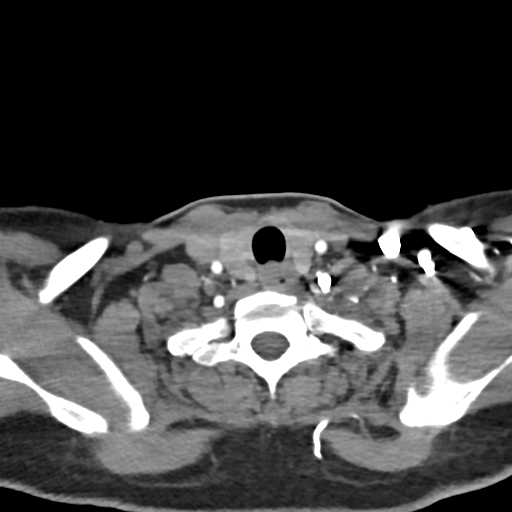
[im 53/112  soft-tissue]
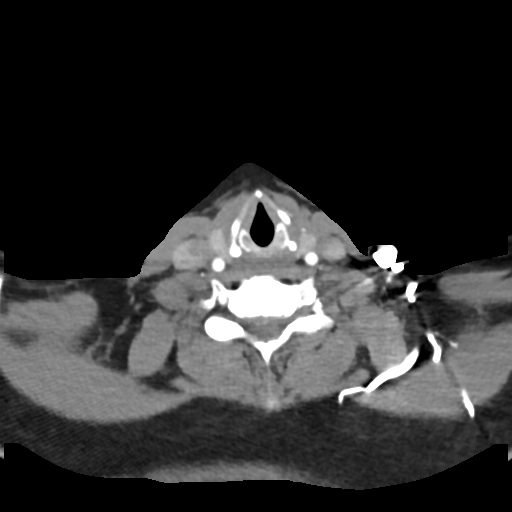
[im 59/112  soft-tissue]
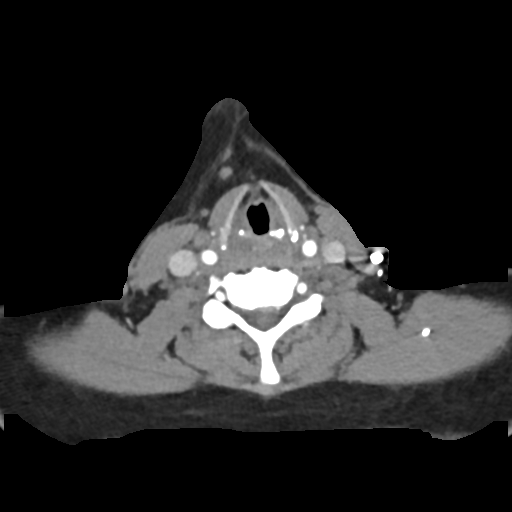
[im 69/112  soft-tissue]
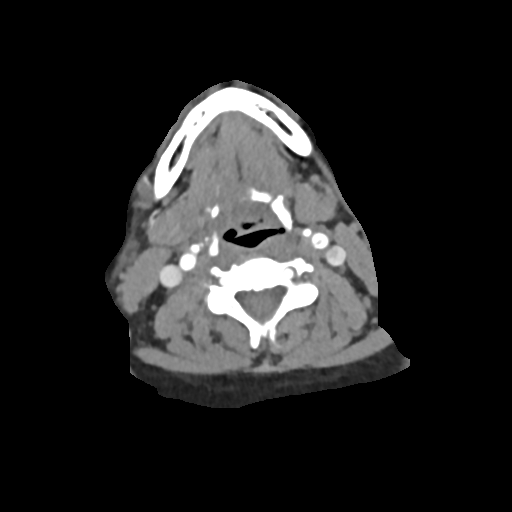
[im 80/112  soft-tissue]
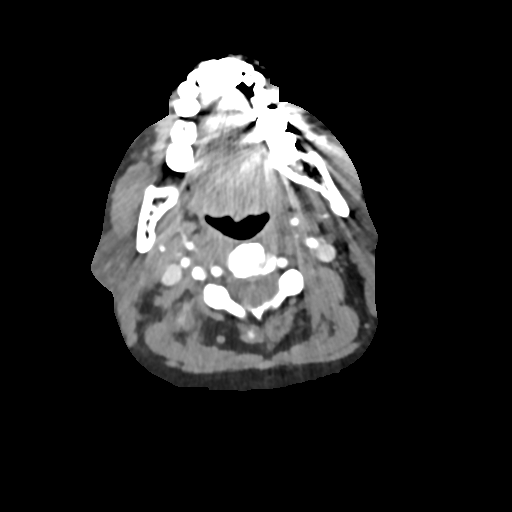
[im 80/112  bone]
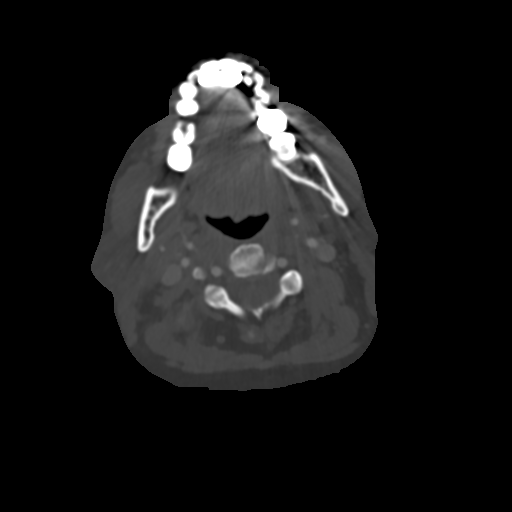
[im 85/112  soft-tissue]
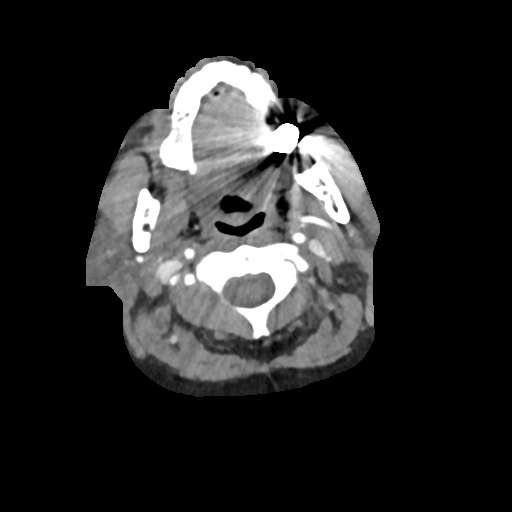
[im 96/112  soft-tissue]
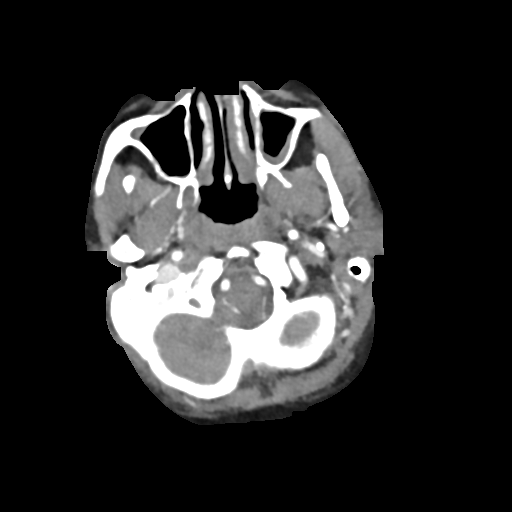
[im 106/112  soft-tissue]
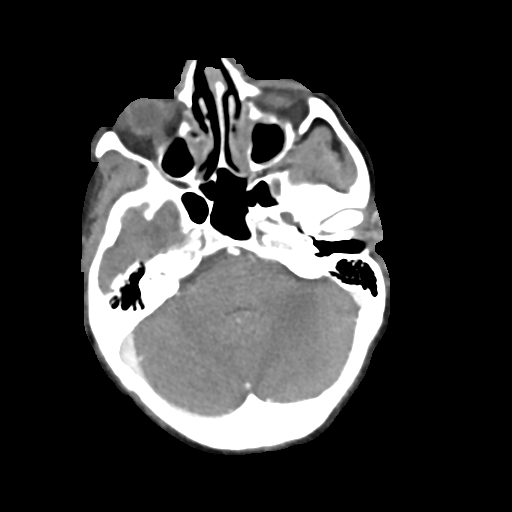

[Series 7: coronal thin · coronal · 0.38mm/px · 3 of 246 slices shown]
[im 71/246  soft-tissue]
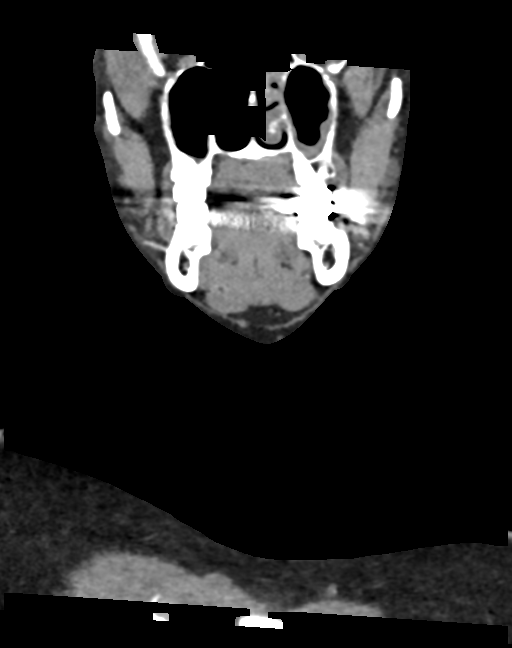
[im 106/246  soft-tissue]
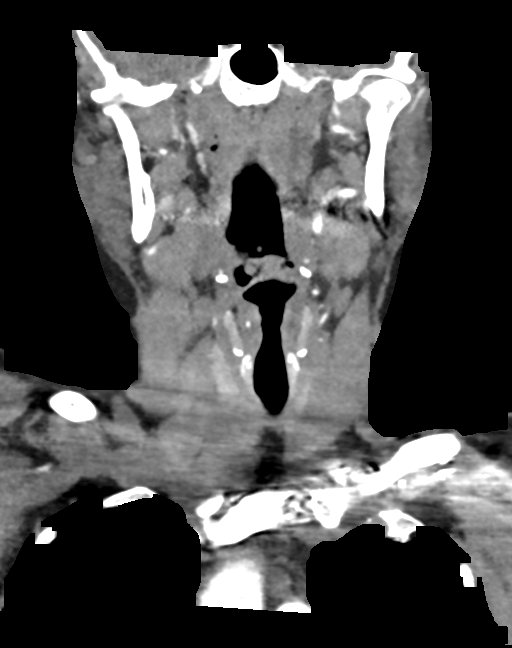
[im 141/246  soft-tissue]
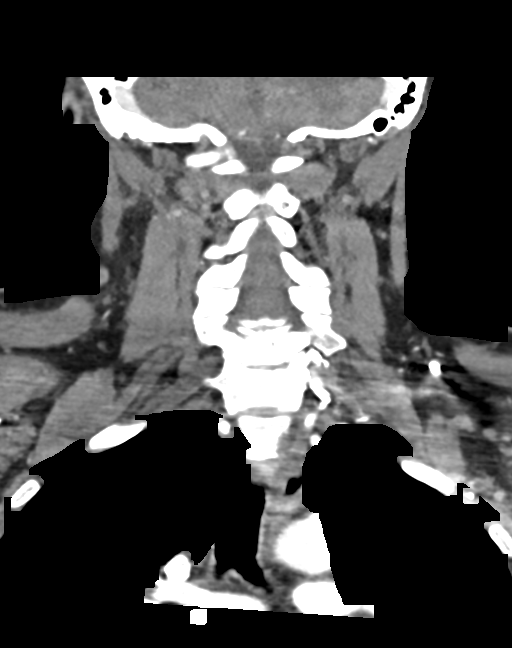

[15 of 46 positions shown; findings below may reference images not displayed]

RADIATION DOSE REDUCTION: This exam was performed according to the
departmental dose-optimization program which includes automated
exposure control, adjustment of the mA and/or kV according to
patient size and/or use of iterative reconstruction technique.

CONTRAST:  75mL OMNIPAQUE IOHEXOL 350 MG/ML SOLN
FINDINGS: Skeleton: No acute osseous abnormality identified. Cervical spine
disc and endplate degeneration.

Upper chest: Stable, negative. Visible central pulmonary arteries
appear patent.

Other neck: Stable, negative.

Negative visible posterior fossa.

Aortic arch: 3 vessel arch configuration. Minimal arch
atherosclerosis only in the distal arch. Small prominent possible
bronchial artery origin on series 5, image 97 is stable.

Right carotid system: Stable and negative. Visible right ICA siphon
appears patent and negative.

Left carotid system: Negative left CCA origin. Left CCA is within
normal limits before the bifurcation.

But at the carotid bifurcation elongated low-density filling defect
tracking into the left ICA bulb has increased most resembling
adherent thrombus (series 5, image 45 versus series 5 image 101
previously, now measuring between 5 and 10 mm and possibly
associated with conspicuous low-density plaque or clot at the
posterior left ICA origin (series 5, image 47).

There is now some hemodynamically significant stenosis around the
clot on series 5, image 45, but distal to that the left ICA is
patent without irregularity through the petrous left ICA siphon.

Vertebral arteries:
Stable and negative. Codominant vertebral arteries without stenosis
to the vertebrobasilar junction. Patent PICA origins.

Anatomic variants: None in the neck.

Review of the MIP images confirms the above findings
IMPRESSION: 1. Enlarged thrombus within the Left ICA origin since the CTA on
[DATE]. See series 5, image 45. This might be clot adherent to
low-density plaque at the posterior left carotid bifurcation.

2. But elsewhere CTA of the Neck remains negative.

Study discussed by telephone with Dr. LS on [DATE] at [DATE] .

## 2022-02-15 IMAGING — CT CT HEAD W/O CM
4 series · 15 of 47 positions shown, 17 images · non-contrast
Comparison: [DATE].

CLINICAL DATA: Stroke, follow up



[Series 2: head without · axial · non-contrast · 0.42mm/px · z∈[-112,-7]mm · 7 of 29 slices shown, 9 images]
[im 4/29  brain]
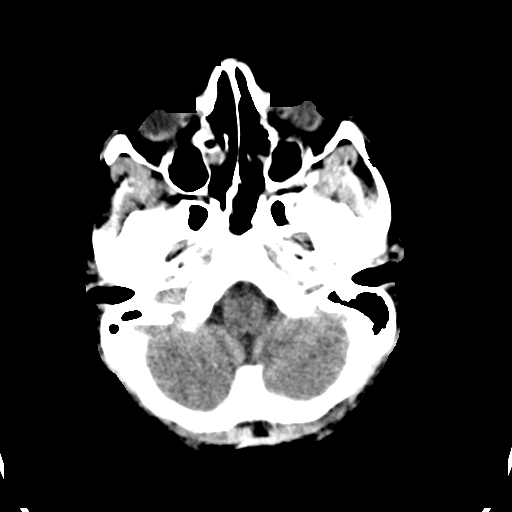
[im 4/29  bone]
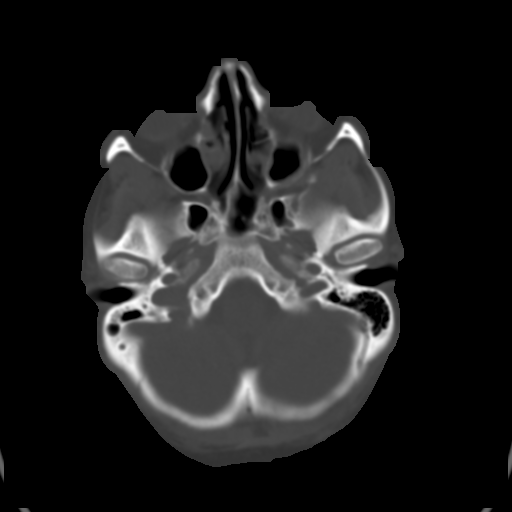
[im 8/29  brain]
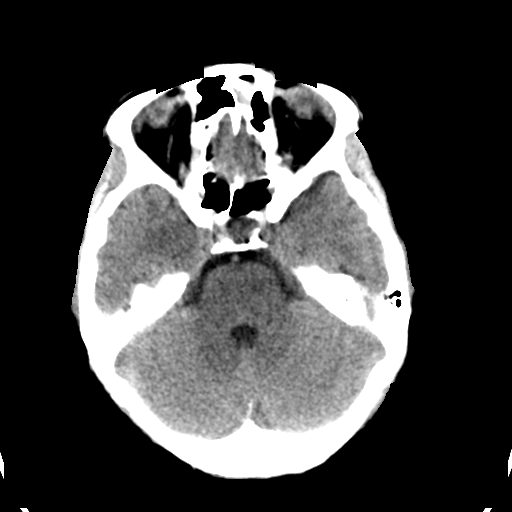
[im 11/29  brain]
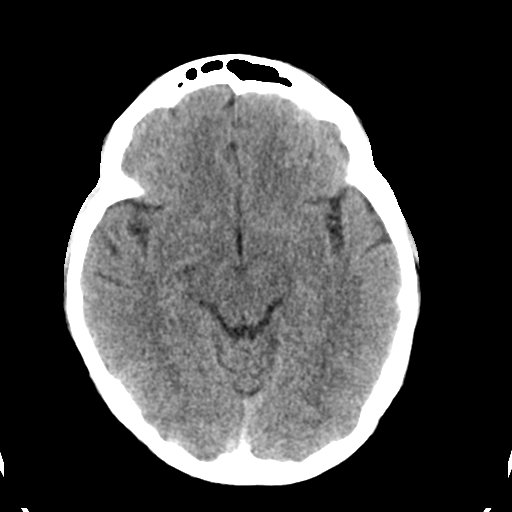
[im 15/29  brain]
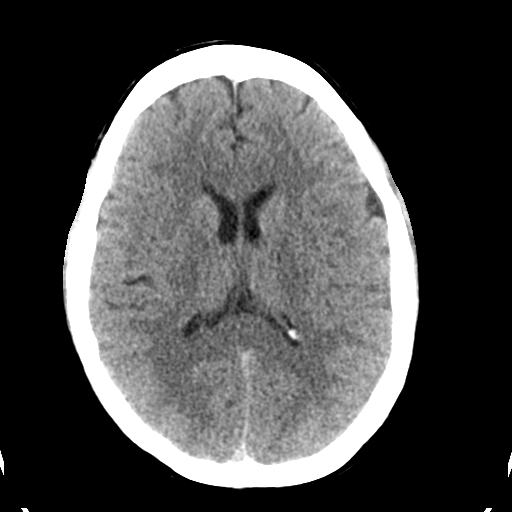
[im 18/29  brain]
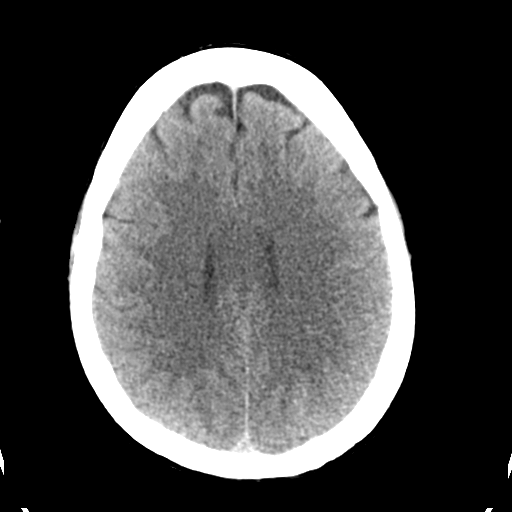
[im 18/29  bone]
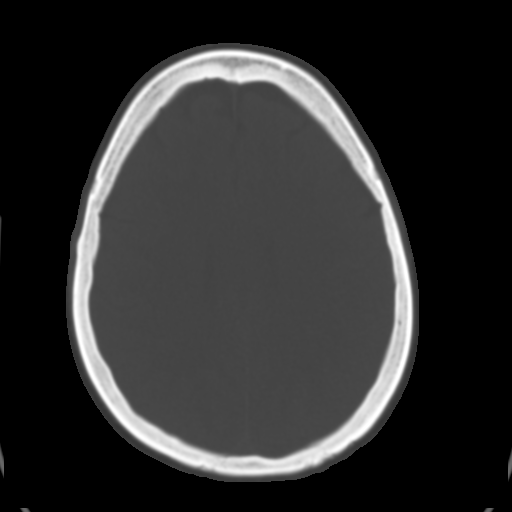
[im 22/29  brain]
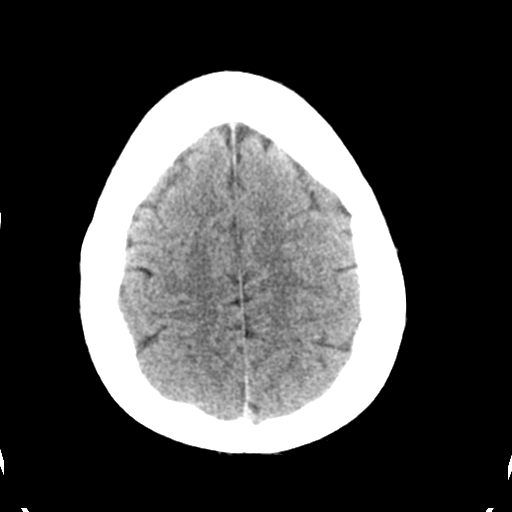
[im 25/29  brain]
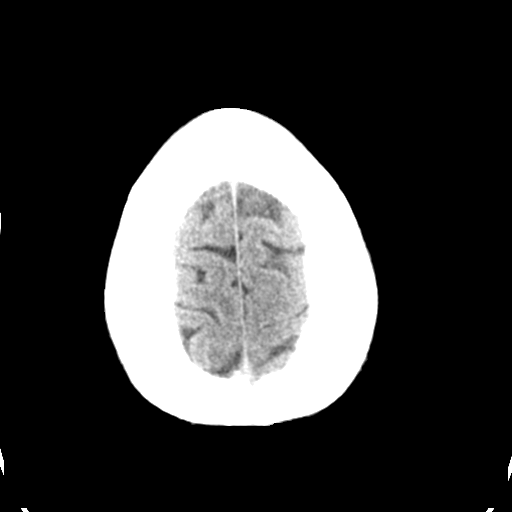

[Series 3: head bone · axial · 0.42mm/px · z∈[-113,-99]mm · 2 of 72 slices shown]
[im 8/72  bone]
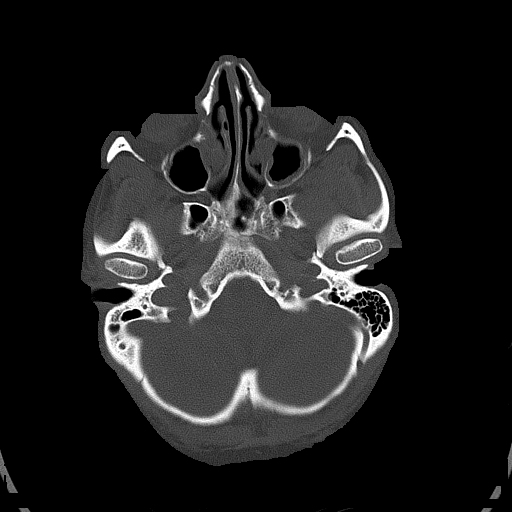
[im 15/72  bone]
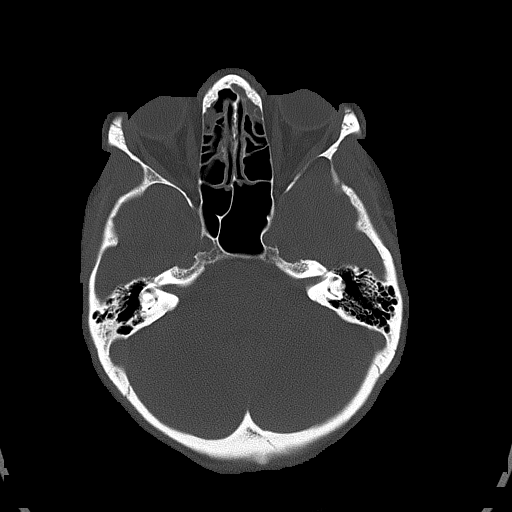

[Series 4: head without cor · coronal · non-contrast · 0.32mm/px · 3 of 70 slices shown]
[im 24/70  brain]
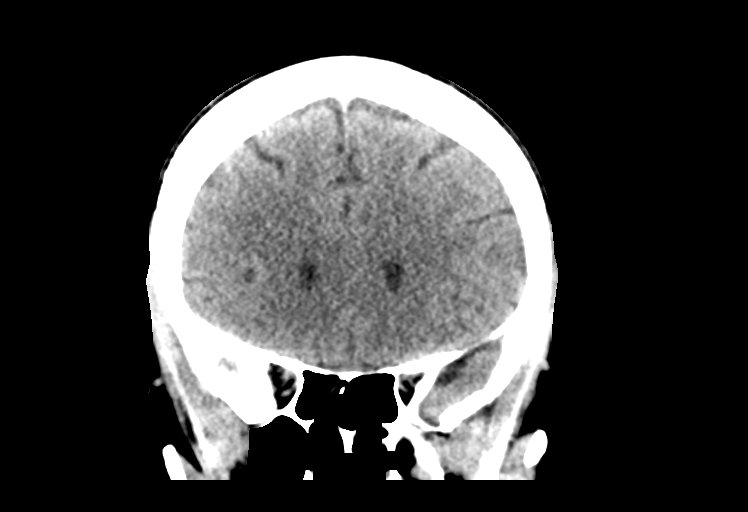
[im 31/70  brain]
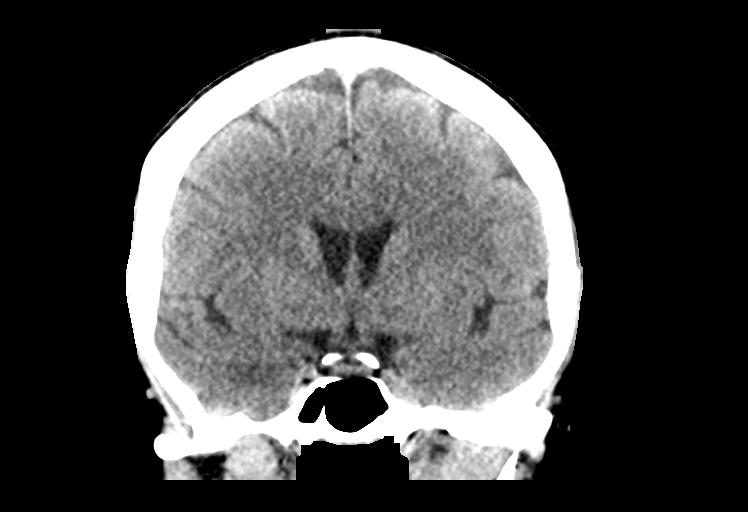
[im 39/70  brain]
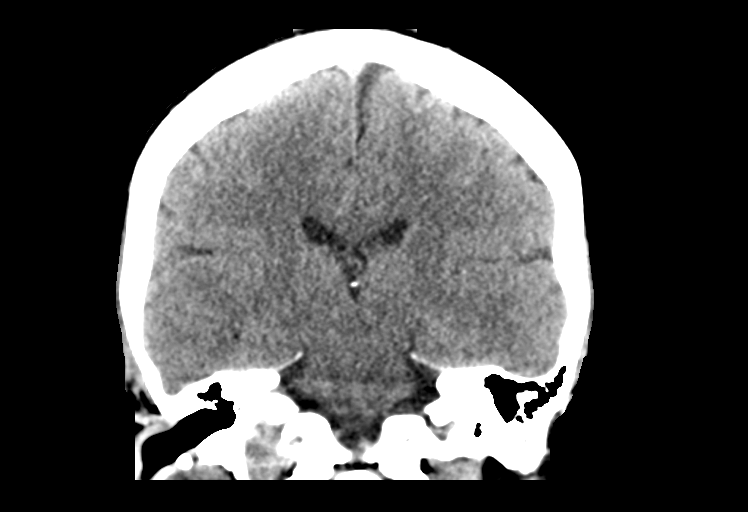

[Series 5: head without sag · sagittal · non-contrast · 0.30mm/px · 3 of 67 slices shown]
[im 23/67  brain]
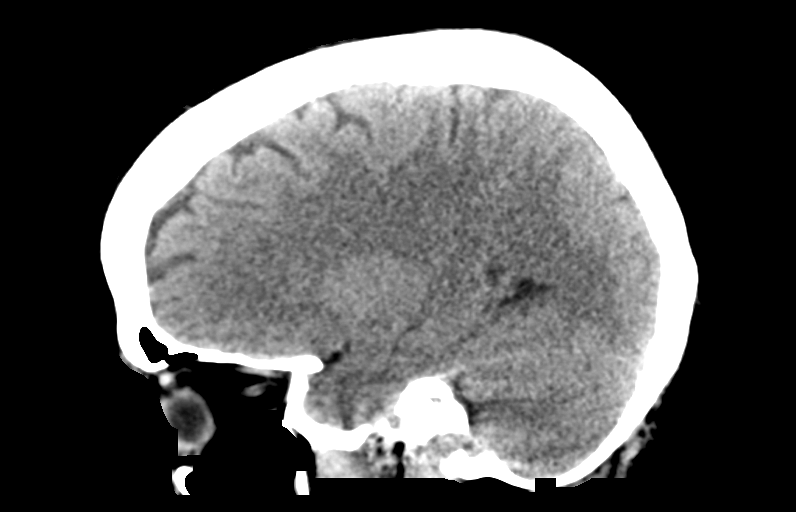
[im 34/67  brain]
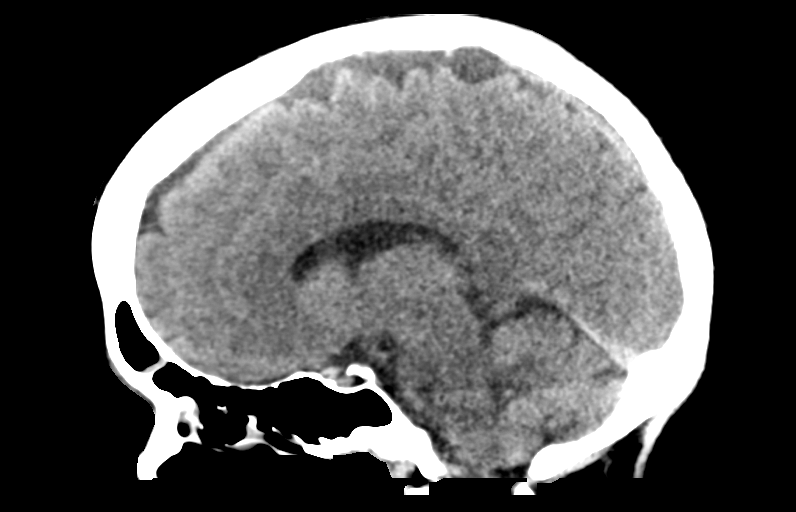
[im 45/67  brain]
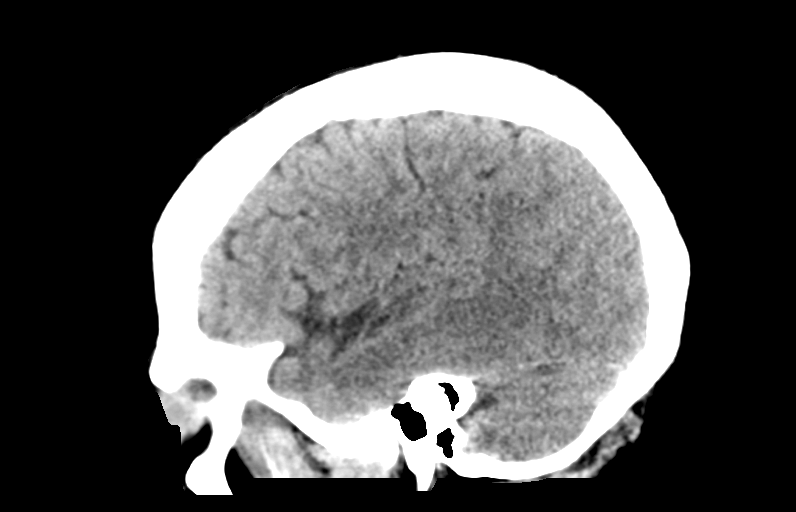

[15 of 47 positions shown; findings below may reference images not displayed]

FINDINGS: Brain: No evidence of acute large vascular territory infarction,
hemorrhage, hydrocephalus, extra-axial collection or mass
lesion/mass effect.

Vascular: No definite hyperdense vessel identified.

Skull: No acute fracture.

Sinuses/Orbits: Moderate paranasal sinus mucosal thickening.

Other: No mastoid effusions.
IMPRESSION: No evidence of acute intracranial abnormality.

## 2022-02-15 IMAGING — XA IR VERTEBRAL  NON-SELECT UNILAT  RIGHT(MS)
10 of 22 series · 10 of 24 positions shown · IV contrast (IODINE)
Comparison: None available.

INDICATION: Intermittent worsening aphasia, and right-sided weakness associated
with right-sided numbness and tingling. Worsening stenosis in the
left internal carotid artery proximally secondary to a lobulated
irregular clot probably associated with a carotid web.

EXAM:
1. EMERGENT LARGE VESSEL OCCLUSION THROMBOLYSIS anterior
CIRCULATION)
TECHNIQUE: Following a full explanation of the procedure along with the
potential associated complications, an informed witnessed consent
was obtained. The risks of intracranial hemorrhage of 10%, worsening
neurological deficit, ventilator dependency, death and inability to
revascularize were all reviewed in detail with the patient's spouse.

[Series 1: cerebral care · 2 acquisitions, 1 frame shown (1 of 10)]
[im 1/2]
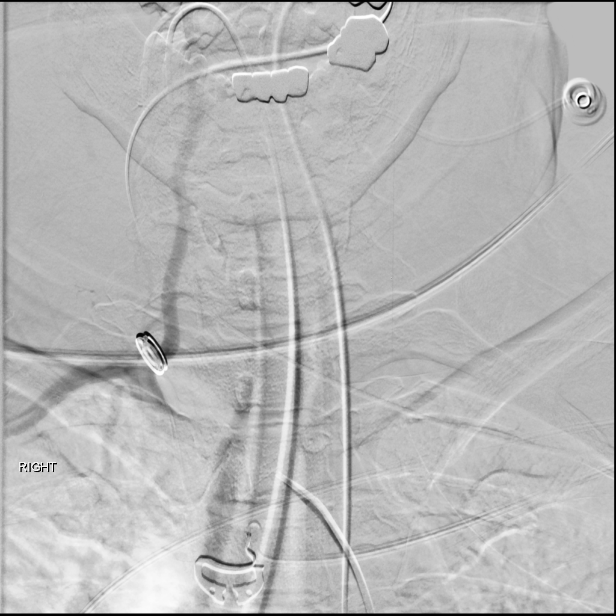

[Series 3: cerebral care · 2 acquisitions, 1 frame shown (2 of 10)]
[im 1/2]
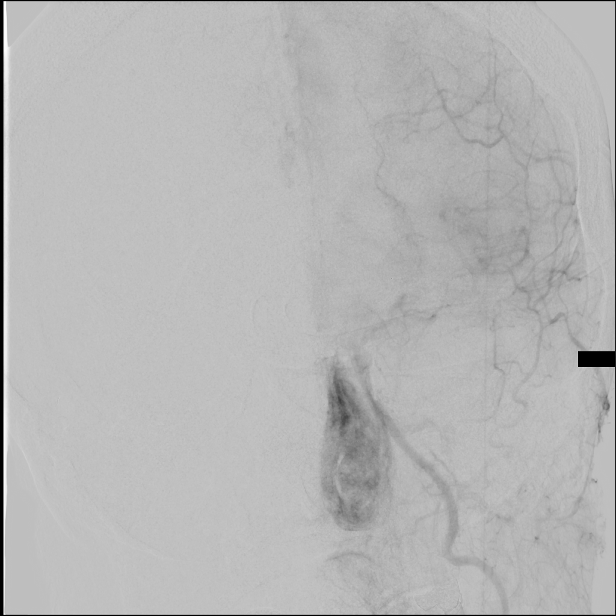

[Series 6: cerebral care · 2 acquisitions, 1 frame shown (3 of 10)]
[im 1/2]
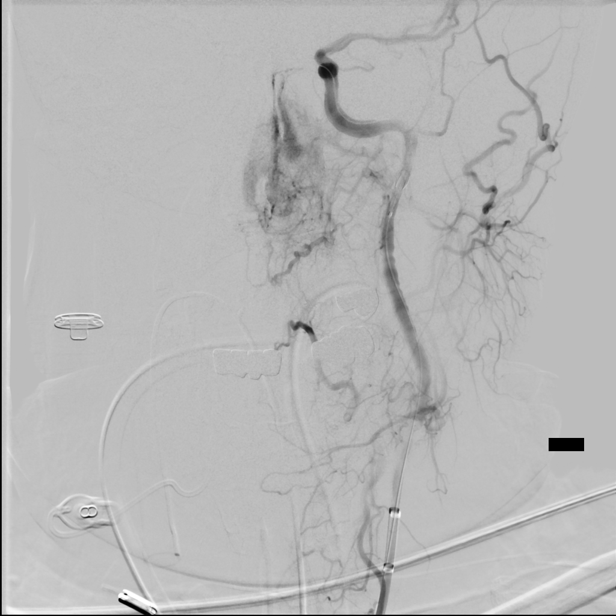

[Series 8: cerebral care · 2 acquisitions, 1 frame shown (4 of 10)]
[im 1/2]
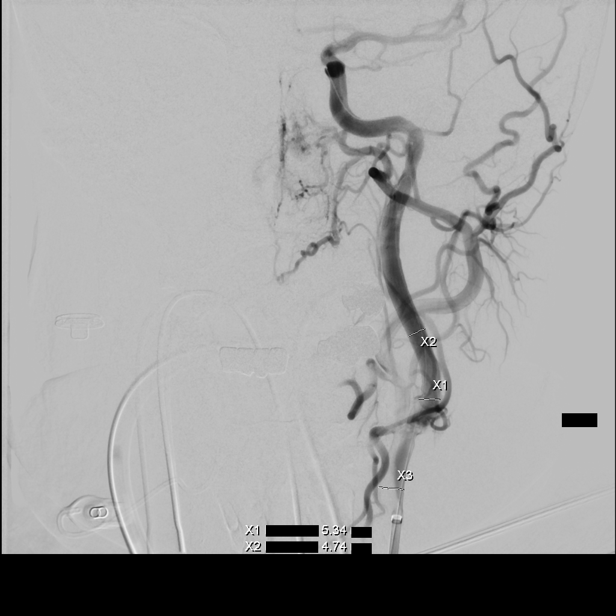

[Series 10: cerebral care · 2 acquisitions, 1 frame shown (5 of 10)]
[im 1/2]
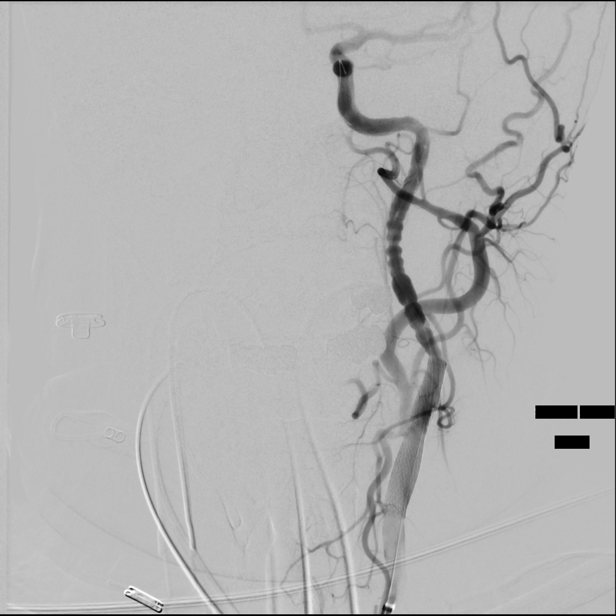

[Series 14: cerebral care · 2 acquisitions, 1 frame shown (6 of 10)]
[im 1/2]
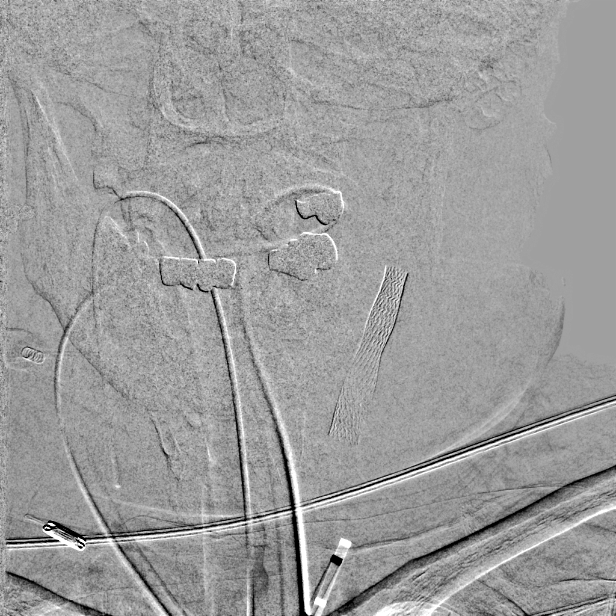

[Series 16: cerebral care · 2 acquisitions, 1 frame shown (7 of 10)]
[im 1/2]
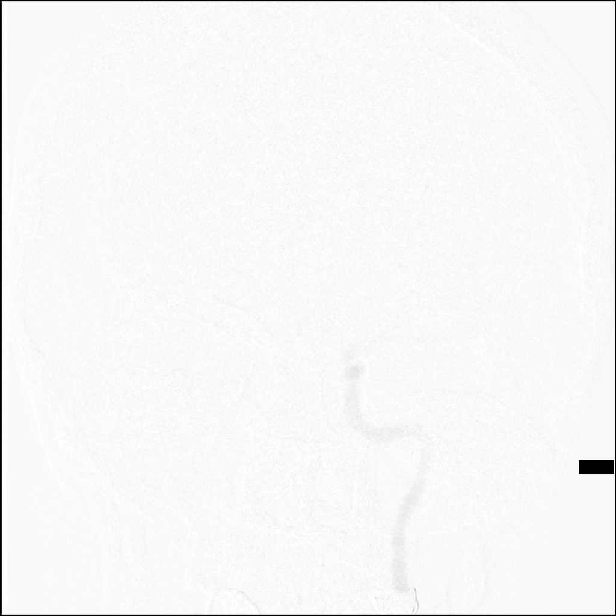

[Series 18: cerebral care · 2 acquisitions, 1 frame shown (8 of 10)]
[im 1/2]
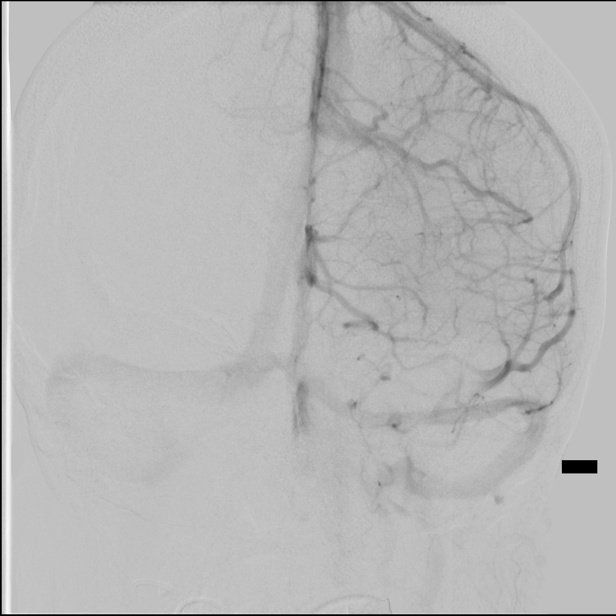

[Series 20: cerebral care · 2 acquisitions, 1 frame shown (9 of 10)]
[im 1/2]
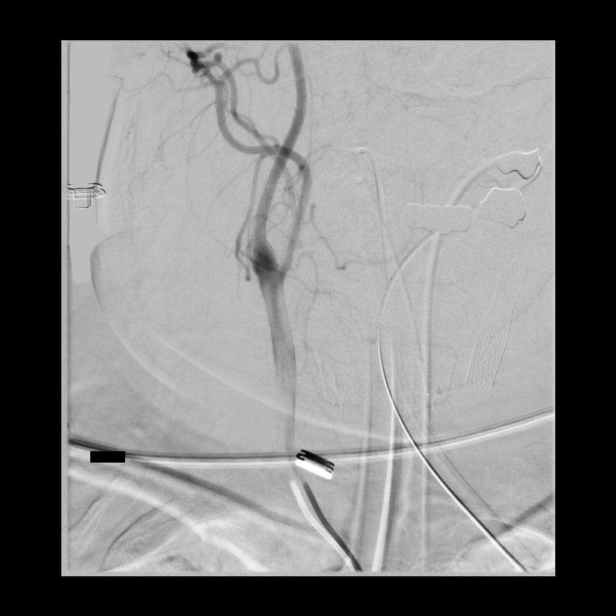

[Series 22: cerebral care · 2 acquisitions, 1 frame shown (10 of 10)]
[im 1/2]
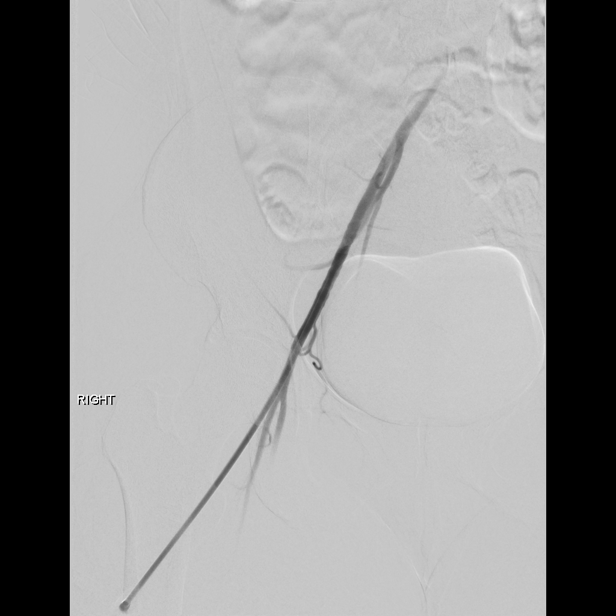

[10 of 24 positions shown; findings below may reference images not displayed]

MEDICATIONS:
Vancomycin 1 g IV antibiotic was administered within 1 hour of the
procedure.

ANESTHESIA/SEDATION:
General anesthesia.

CONTRAST:  Omnipaque 300 approximately 105 cc.

FLUOROSCOPY TIME:  Fluoroscopy Time: 16 minutes 3 seconds ([0T]
mGy).

COMPLICATIONS:
None immediate.
The patient was then put under general anesthesia by the [REDACTED] at [HOSPITAL].

The right groin was prepped and draped in the usual sterile fashion.
Thereafter using modified Seldinger technique, transfemoral access
into the right common femoral artery was obtained without
difficulty. Over a 0.035 inch guidewire an 8 French 25 cm Pinnacle
sheath was inserted. Through this, and also over a 0.035 inch
guidewire a combination of a 6 ERXLEBEN support catheter
inside of an 087 95 cm balloon guide catheter was advanced and
positioned in the distal left common carotid artery. The guidewire,
and the 125 cm support catheter were removed. Good aspiration
obtained from the hub of the balloon guide catheter. Gentle control
arteriogram performed through this demonstrated no evidence of
spasms, dissections or of intraluminal filling defects. A control
DSA was then perfor[REDACTED]ed extra cranially and intracranially
in the left common carotid artery.
FINDINGS: Left common carotid arteriogram demonstrates the left external
carotid artery and its major branches to be widely patent.

The left internal carotid artery at the bulb demonstrates a
lobulated irregular heterogeneous filling defect with a stenosis of
approximately 80% on the lateral DSA.

More distally, the left internal carotid artery is seen to opacify
to the cranial skull base. The petrous, the cavernous and the
supraclinoid segments demonstrate wide patency.

Arising in the PCOM region is an approximate 3.2 mm x 2.1 mm
saccular outpouching separate from the adjacent left posterior
communicating artery.

The left middle cerebral artery and the left anterior cerebral
artery opacify into the capillary and venous phases. No angiographic
evidence of intraluminal filling defects or of occlusions is noted.

PROCEDURE:
A NAV 4/7 mm Emboshield distal protection device was then flushed
occluded in its housing.

The filter device was then captured into the delivery microcatheter.
The combination of the delivery microcatheter with the 014 inch
micro guidewire with a moderate J configuration was then advanced to
the distal end of the balloon guide catheter.

Using a torque device, the micro guidewire was then gently advanced
along the anterior border of the carotid bulb followed by the
microcatheter. The combination was then advanced to the cervical
petrous junction of the left internal carotid artery.

Filter was then deployed under biplane fluoroscopy. With the filter
stable, the delivery microcatheter was retrieved and removed. A
control arteriogram was then performed through the balloon guide
catheter in the left common carotid artery. This demonstrated
moderate spasm in the distal left ICA cervical region. This
responded to aliquot of a 25 mcg of nitroglycerin intra-arterially.
Control arteriogram performed through the balloon guide catheter
intracranially demonstrated no evidence of intraluminal filling
defects or of occlusions.

Measurements were then performed of the left internal carotid artery
just distal to the carotid bulb, and proximally into the distal left
common carotid artery.

A 4 mm x 30 mm Viatrac 14 balloon microcatheter was then prepped and
purged with heparinized saline infusion retrogradely, and with 50%
contrast and 50% heparinized saline infusion antegradely.

Using the rapid exchange technique, the balloon was then advanced to
the proximal left internal carotid artery in the distal left common
carotid artery.

Proximal flow arrest was then initiated by inflating the balloon in
the distal left common carotid artery, with aspiration with a 20 mL
syringe through the side port of the balloon guide catheter as a
control angioplasty was then performed using micro inflation syringe
device via micro tubing to approximately 9 atmospheres achieving a
diameter of just over 4 mm for about a minute. No hemodynamic
changes were encountered. Balloons were deflated and retrieved. Flow
arrest was reversed as was the aspiration. A control arteriogram
performed through the balloon guide catheter demonstrated improved
caliber and flow through the proximal left internal carotid artery.

A 9-7 mm x 40 mm Xact stent was then chosen for delivery. This was
then retrogradely flushed with heparinized saline infusion. Using
the rapid exchange technique, the stent delivery apparatus was then
advanced distally, and distal and proximal markers were positioned
in the desired position.

This was then deployed in the usual manner without any difficulty.
With the distal protection device held stable, the delivery
apparatus was retrieved and removed. A control arteriogram performed
through the balloon guide catheter in the distal left common carotid
artery demonstrated excellent apposition and near normal caliber of
the stented segment of the left internal carotid artery. A control
arteriogram performed intracranially continued to demonstrate no
evidence of intraluminal filling defects or of occlusions. A CT of
the brain was then performed which demonstrated no evidence of
intracranial hemorrhage.

At this time, the patient was given half a bolus dose of IV
cangrelor followed by a 4 hour infusion. At approximately 25 minutes
post deployment of the stent, the filter was captured into the
filter capture microcatheter which was advanced again using the
rapid exchange technique to the proximal marker. The filter device
was captured by retrieving the micro guidewire into the capture
device and removed together under constant fluoroscopic guidance.

Biplane DSA demonstrated continued excellent apposition and flow
through the proximal left internal carotid artery without any
evidence of intraluminal filling defects.

Balloon catheter was then retrieved and removed. A 5 French
diagnostic catheter was now advanced into the right common carotid
artery. A control arteriogram performed through this demonstrated
the right external carotid artery and its major branches to be
widely patent.

The right internal carotid artery at the bulb to the cranial skull
base demonstrates wide patency.

The petrous, the cavernous and the supraclinoid segments
demonstrated adequate caliber and flow intracranially. A small 2 mm
infundibulum is noted in the PCOM region of the right internal
carotid artery. The right middle and the right anterior cerebral
arteries opacify into the capillary and venous phases.

The diagnostic catheter was removed. An 8 French Angio-Seal closure
device was then deployed for hemostasis at the right groin puncture
site. Both the posterior tibial pulses were palpable. The dorsalis
pedis pulses remained undopplerable unchanged from prior to the
procedure. A flat panel CT of the brain demonstrated no evidence of
hemorrhagic complications. The patient's general anesthesia was
reversed, and patient was extubated without difficulty. Upon
recovery, the patient denied any headaches, nausea or vomiting. She
was able to move all fours equally. She was able to verbalize
appropriately. She was then transferred to the neuro ICU for post
revascularization care.
IMPRESSION: IMPRESSION
Status post endovascular revascularization of symptomatic
progressive stenosis of the proximal left internal carotid artery
due to clot formation. Underlying pathology probably related to a
carotid web.

Approximally 3.1 mm x 2.1 mm aneurysm in the left PCOM region.

PLAN:
Follow-up in the clinic 2 weeks post discharge.

## 2022-02-15 IMAGING — MR MR HEAD W/O CM
12 of 13 series · 44 of 48 positions shown · non-contrast
Comparison: Brain MRI [DATE] and earlier.

CLINICAL DATA: 51-year-old female status post code stroke
presentation on [DATE], evidence of left ICA thrombus on CTA.
Punctate left hemisphere infarcts on MRI at that time. Recurrent
right extremity symptoms, word finding difficulty.

EXAM:
MRI HEAD WITHOUT CONTRAST
TECHNIQUE: Multiplanar, multiecho pulse sequences of the brain and surrounding
structures were obtained without intravenous contrast.

[Series 5: DWI · axial · 3.0mm · 0.88mm/px · z∈[-69,+95]mm · 8 of 111 slices shown (1 of 4)]
[im 1/111]
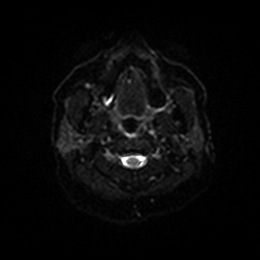
[im 16/111]
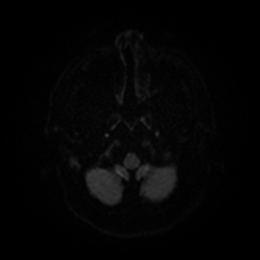
[im 32/111]
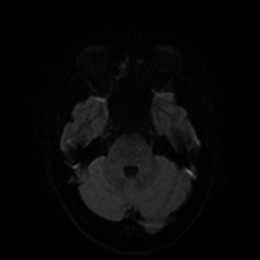
[im 48/111]
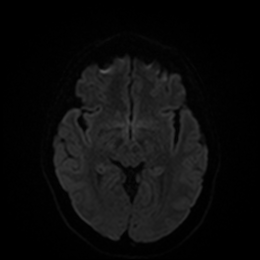
[im 63/111]
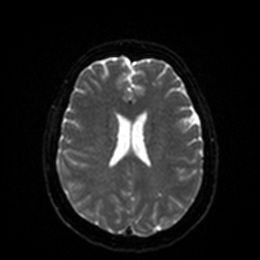
[im 79/111]
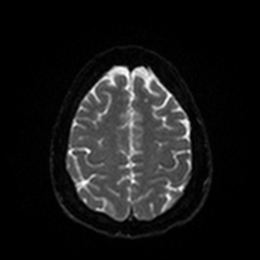
[im 95/111]
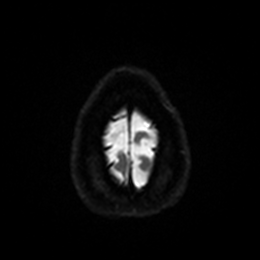
[im 111/111]
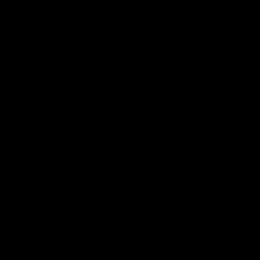

[Series 6: DWI · axial · 3.0mm · 0.88mm/px · z∈[-69,+90]mm · 4 of 54 slices shown (2 of 4)]
[im 1/54]
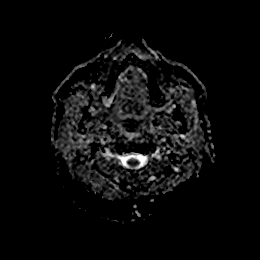
[im 18/54]
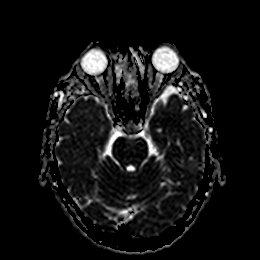
[im 36/54]
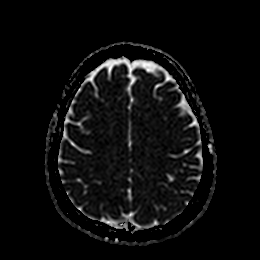
[im 54/54]
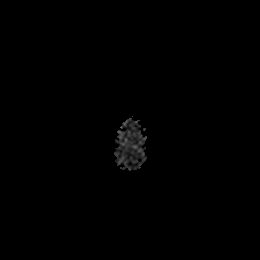

[Series 7: DWI · coronal · 4.0mm · 0.88mm/px · 6 of 82 slices shown (3 of 4)]
[im 1/82]
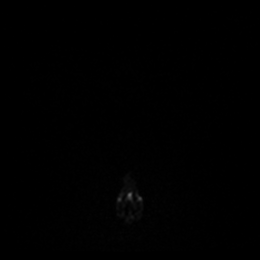
[im 17/82]
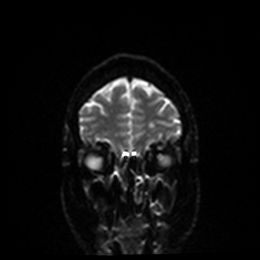
[im 33/82]
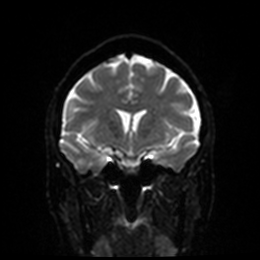
[im 49/82]
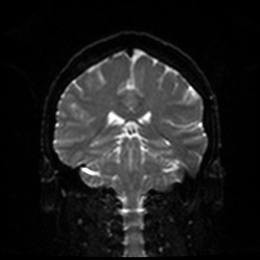
[im 65/82]
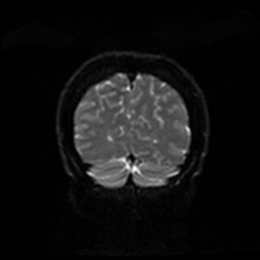
[im 82/82]
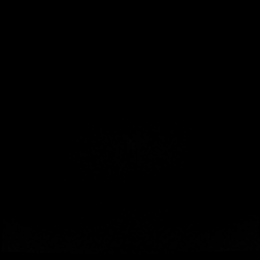

[Series 8: DWI · coronal · 4.0mm · 0.88mm/px · 3 of 40 slices shown (4 of 4)]
[im 1/40]
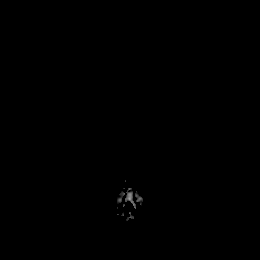
[im 20/40]
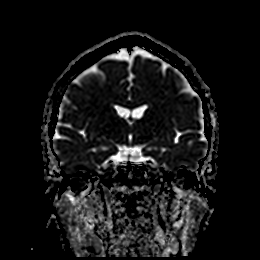
[im 40/40]
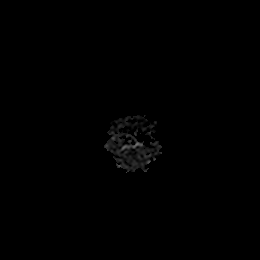

[Series 9: T1 · sagittal · 5.0mm · 0.75mm/px · 2 of 28 slices shown]
[im 1/28]
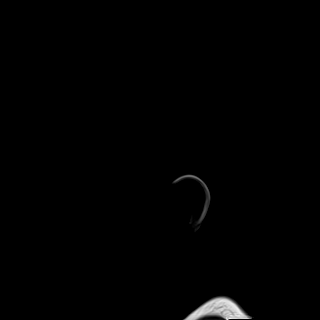
[im 28/28]
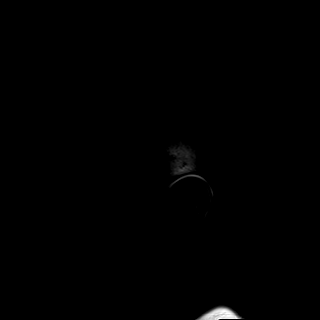

[Series 10: T2 · axial · 5.0mm · 0.72mm/px · z∈[-67,+94]mm · 2 of 28 slices shown (1 of 2)]
[im 1/28]
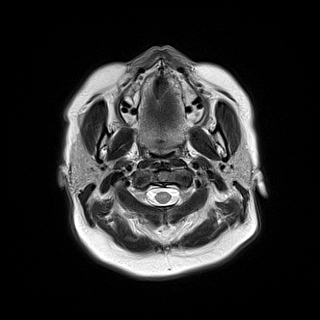
[im 28/28]
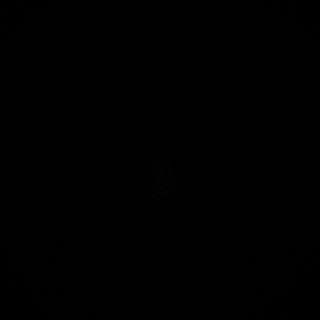

[Series 11: FLAIR · axial · 5.0mm · 0.45mm/px · z∈[-68,+93]mm · 2 of 28 slices shown]
[im 1/28]
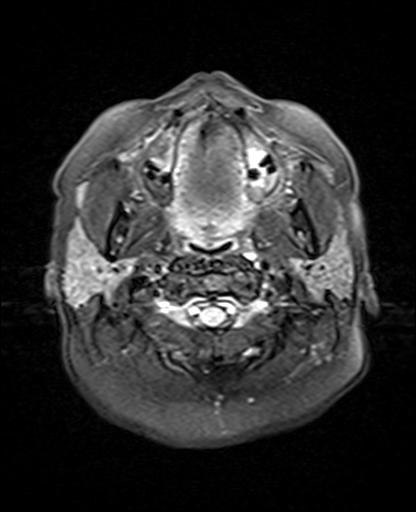
[im 28/28]
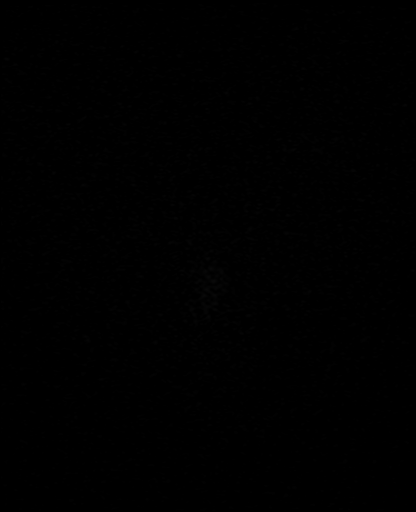

[Series 12: mag_images · axial · 3.0mm · 0.94mm/px · z∈[-72,+93]mm · 4 of 56 slices shown]
[im 1/56]
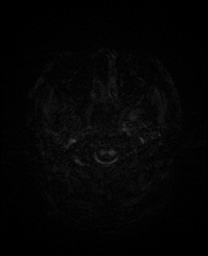
[im 19/56]
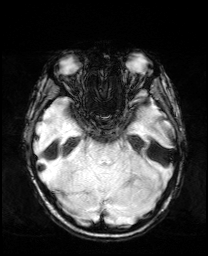
[im 37/56]
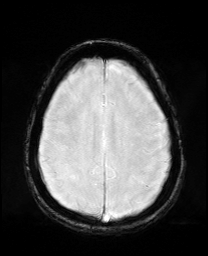
[im 56/56]
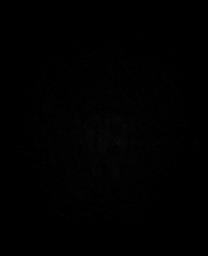

[Series 13: pha_images · axial · 3.0mm · 0.94mm/px · z∈[-72,+84]mm · 4 of 53 slices shown]
[im 1/53]
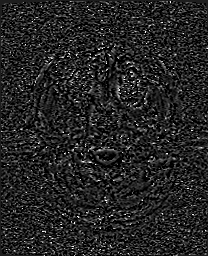
[im 18/53]
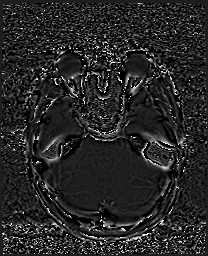
[im 35/53]
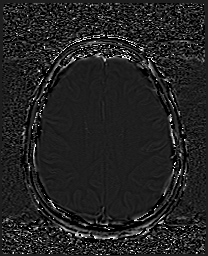
[im 53/53]
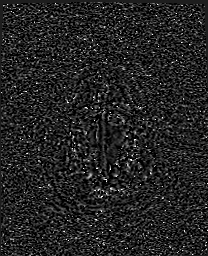

[Series 14: swi_images · axial · 3.0mm · 0.94mm/px · z∈[-72,+93]mm · 4 of 56 slices shown]
[im 1/56]
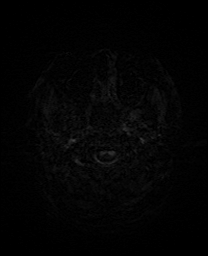
[im 19/56]
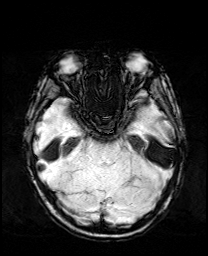
[im 37/56]
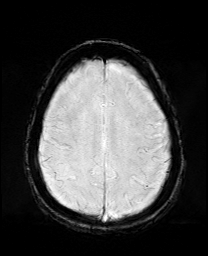
[im 56/56]
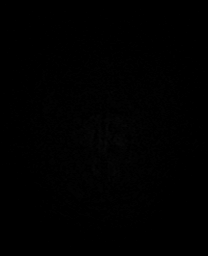

[Series 15: mip_images(sw) · axial · 24.0mm · 0.94mm/px · z∈[-61,+82]mm · 3 of 49 slices shown]
[im 1/49]
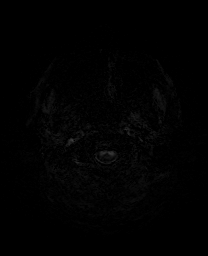
[im 25/49]
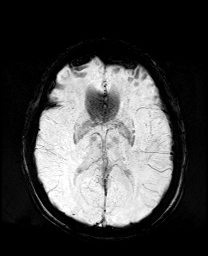
[im 49/49]
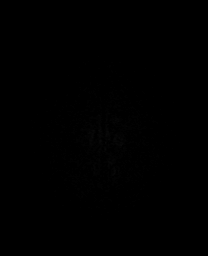

[Series 17: T2 · coronal · 5.0mm · 0.34mm/px · 2 of 33 slices shown (2 of 2)]
[im 1/33]
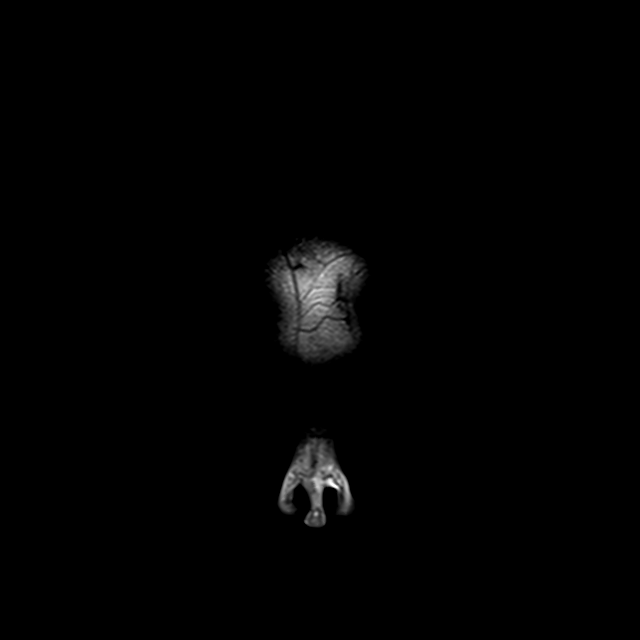
[im 33/33]
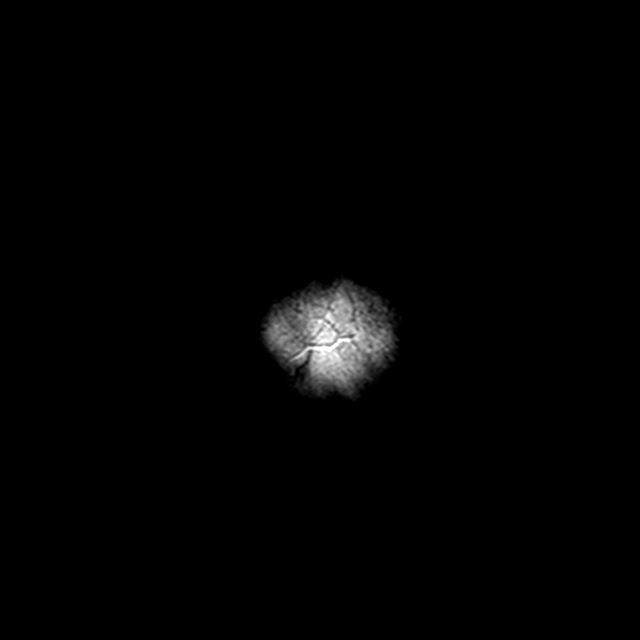

[44 of 48 positions shown; findings below may reference images not displayed]

FINDINGS: Brain: Scattered punctate and occasionally patchy foci of restricted
diffusion in the left hemisphere are increased in conspicuity since
[DATE]. Involvement in the left frontal lobe including the
anterior and posterior middle frontal gyrus (series 7, image 71),
left perirolandic area superiorly (series 5, image 98), left
parietal lobe, superior left occipital lobe along the left
parietooccipital sulcus (series 5, image 85), and also the left
occipital pole (series 5, image 76). Left posterior communicating
artery present on the recent CTA.

Deep gray nuclei are spared. No contralateral right hemisphere or
posterior fossa restricted diffusion identified.

Mild T2 and FLAIR hyperintensity in the affected areas (left
perirolandic cortex and subcortical white matter series 11, image
21). No acute intracranial hemorrhage identified.

Scattered additional nonspecific cerebral white matter T2 and FLAIR
hyperintensity appears stable. No chronic cerebral blood products
identified on SWI. No midline shift, mass effect, evidence of mass
lesion, ventriculomegaly, extra-axial collection. Cervicomedullary
junction and pituitary are within normal limits.

Vascular: Major intracranial vascular flow voids are preserved,
stable.

Skull and upper cervical spine: Negative.

Sinuses/Orbits: Stable paranasal sinus mucosal thickening. Orbits
appear stable and negative.

Other: Mastoids remain clear.  Negative visible scalp and face.
IMPRESSION: 1. Increased conspicuity and mild progression of scattered but
mostly punctate infarcts in the Left cerebral hemisphere.
Involvement in both the Left MCA and PCA territories now (newly
including the left occipital pole).

2. No associated hemorrhage or mass effect. No other vascular
territories affected.

## 2022-02-15 SURGERY — IR WITH ANESTHESIA
Anesthesia: General

## 2022-02-15 MED ORDER — EPHEDRINE SULFATE-NACL 50-0.9 MG/10ML-% IV SOSY
PREFILLED_SYRINGE | INTRAVENOUS | Status: DC | PRN
  Administered 2022-02-15: 5 mg via INTRAVENOUS
  Administered 2022-02-15: 10 mg via INTRAVENOUS

## 2022-02-15 MED ORDER — VANCOMYCIN HCL IN DEXTROSE 1-5 GM/200ML-% IV SOLN
INTRAVENOUS | Status: AC
  Filled 2022-02-15: qty 200

## 2022-02-15 MED ORDER — ONDANSETRON HCL 4 MG/2ML IJ SOLN
INTRAMUSCULAR | Status: AC
  Administered 2022-02-15: 4 mg via INTRAVENOUS
  Filled 2022-02-15: qty 2

## 2022-02-15 MED ORDER — ASPIRIN 81 MG PO CHEW: 81.0000 mg | CHEWABLE_TABLET | Freq: Every day | ORAL | Status: DC

## 2022-02-15 MED ORDER — IOHEXOL 300 MG/ML  SOLN
100.0000 mL | Freq: Once | INTRAMUSCULAR | Status: AC | PRN
  Administered 2022-02-15: 50 mL via INTRA_ARTERIAL

## 2022-02-15 MED ORDER — CLEVIDIPINE BUTYRATE 0.5 MG/ML IV EMUL: 0.0000 mg/h | INTRAVENOUS | Status: DC

## 2022-02-15 MED ORDER — IOHEXOL 350 MG/ML SOLN
100.0000 mL | Freq: Once | INTRAVENOUS | Status: AC | PRN
  Administered 2022-02-15: 75 mL via INTRAVENOUS

## 2022-02-15 MED ORDER — ACETAMINOPHEN 325 MG PO TABS
650.0000 mg | ORAL_TABLET | ORAL | Status: DC | PRN
  Administered 2022-02-15 – 2022-02-17 (×2): 650 mg via ORAL
  Filled 2022-02-15 (×2): qty 2

## 2022-02-15 MED ORDER — ONDANSETRON HCL 4 MG/2ML IJ SOLN: 4.0000 mg | Freq: Once | INTRAMUSCULAR | Status: AC

## 2022-02-15 MED ORDER — PHENYLEPHRINE 80 MCG/ML (10ML) SYRINGE FOR IV PUSH (FOR BLOOD PRESSURE SUPPORT)
PREFILLED_SYRINGE | INTRAVENOUS | Status: DC | PRN
  Administered 2022-02-15: 80 ug via INTRAVENOUS
  Administered 2022-02-15 (×4): 160 ug via INTRAVENOUS

## 2022-02-15 MED ORDER — FENTANYL CITRATE (PF) 250 MCG/5ML IJ SOLN
INTRAMUSCULAR | Status: DC | PRN
  Administered 2022-02-15: 100 ug via INTRAVENOUS

## 2022-02-15 MED ORDER — SODIUM CHLORIDE (PF) 0.9 % IJ SOLN
INTRAVENOUS | Status: AC | PRN
  Administered 2022-02-15: 25 ug via INTRA_ARTERIAL

## 2022-02-15 MED ORDER — SODIUM CHLORIDE 0.9 % IV BOLUS
500.0000 mL | Freq: Once | INTRAVENOUS | Status: AC
  Administered 2022-02-15: 500 mL via INTRAVENOUS

## 2022-02-15 MED ORDER — SODIUM CHLORIDE 0.9 % IV SOLN: INTRAVENOUS | Status: DC

## 2022-02-15 MED ORDER — NITROGLYCERIN 1 MG/10 ML FOR IR/CATH LAB
INTRA_ARTERIAL | Status: AC
  Filled 2022-02-15: qty 10

## 2022-02-15 MED ORDER — PROPOFOL 10 MG/ML IV BOLUS
INTRAVENOUS | Status: DC | PRN
  Administered 2022-02-15: 200 mg via INTRAVENOUS

## 2022-02-15 MED ORDER — TICAGRELOR 90 MG PO TABS
90.0000 mg | ORAL_TABLET | Freq: Two times a day (BID) | ORAL | Status: DC
  Filled 2022-02-15: qty 1

## 2022-02-15 MED ORDER — ASPIRIN 81 MG PO TBEC
DELAYED_RELEASE_TABLET | ORAL | Status: AC | PRN
  Administered 2022-02-15: 81 mg via ORAL

## 2022-02-15 MED ORDER — CANGRELOR BOLUS VIA INFUSION
INTRAVENOUS | Status: AC | PRN
  Administered 2022-02-15: 1194 ug via INTRAVENOUS

## 2022-02-15 MED ORDER — SODIUM CHLORIDE 0.9 % IV SOLN: INTRAVENOUS | Status: DC | PRN

## 2022-02-15 MED ORDER — FERROUS SULFATE 325 (65 FE) MG PO TABS
325.0000 mg | ORAL_TABLET | Freq: Three times a day (TID) | ORAL | Status: DC
  Administered 2022-02-15 – 2022-02-17 (×6): 325 mg via ORAL
  Filled 2022-02-15 (×6): qty 1

## 2022-02-15 MED ORDER — VANCOMYCIN HCL 1000 MG IV SOLR
INTRAVENOUS | Status: DC | PRN
  Administered 2022-02-15: 1000 mg via INTRAVENOUS

## 2022-02-15 MED ORDER — TICAGRELOR 90 MG PO TABS
ORAL_TABLET | ORAL | Status: AC
  Filled 2022-02-15: qty 2

## 2022-02-15 MED ORDER — IOHEXOL 300 MG/ML  SOLN
100.0000 mL | Freq: Once | INTRAMUSCULAR | Status: AC | PRN
  Administered 2022-02-15: 55 mL via INTRA_ARTERIAL

## 2022-02-15 MED ORDER — ROCURONIUM BROMIDE 10 MG/ML (PF) SYRINGE
PREFILLED_SYRINGE | INTRAVENOUS | Status: DC | PRN
  Administered 2022-02-15: 70 mg via INTRAVENOUS

## 2022-02-15 MED ORDER — ALBUMIN HUMAN 5 % IV SOLN
12.5000 g | Freq: Four times a day (QID) | INTRAVENOUS | Status: DC
  Administered 2022-02-15 – 2022-02-16 (×3): 12.5 g via INTRAVENOUS
  Filled 2022-02-15 (×3): qty 250

## 2022-02-15 MED ORDER — SODIUM CHLORIDE 0.9 % IV SOLN
INTRAVENOUS | Status: AC | PRN
  Administered 2022-02-15: 2 ug/kg/min via INTRAVENOUS

## 2022-02-15 MED ORDER — LIDOCAINE 2% (20 MG/ML) 5 ML SYRINGE
INTRAMUSCULAR | Status: DC | PRN
  Administered 2022-02-15: 100 mg via INTRAVENOUS

## 2022-02-15 MED ORDER — ASPIRIN 81 MG PO CHEW
CHEWABLE_TABLET | ORAL | Status: AC
  Filled 2022-02-15: qty 1

## 2022-02-15 MED ORDER — ACETAMINOPHEN 650 MG RE SUPP: 650.0000 mg | RECTAL | Status: DC | PRN

## 2022-02-15 MED ORDER — TICAGRELOR 90 MG PO TABS
90.0000 mg | ORAL_TABLET | Freq: Two times a day (BID) | ORAL | Status: DC
  Administered 2022-02-15 – 2022-02-17 (×4): 90 mg via ORAL
  Filled 2022-02-15 (×4): qty 1

## 2022-02-15 MED ORDER — ACETAMINOPHEN 160 MG/5ML PO SOLN: 650.0000 mg | ORAL | Status: DC | PRN

## 2022-02-15 MED ORDER — FENTANYL CITRATE (PF) 100 MCG/2ML IJ SOLN
INTRAMUSCULAR | Status: AC
  Filled 2022-02-15: qty 2

## 2022-02-15 MED ORDER — TICAGRELOR 60 MG PO TABS
ORAL_TABLET | ORAL | Status: AC | PRN
  Administered 2022-02-15: 180 mg via NASOGASTRIC

## 2022-02-15 MED ORDER — CANGRELOR TETRASODIUM 50 MG IV SOLR
INTRAVENOUS | Status: AC
  Filled 2022-02-15: qty 50

## 2022-02-15 MED ORDER — SUGAMMADEX SODIUM 200 MG/2ML IV SOLN
INTRAVENOUS | Status: DC | PRN
  Administered 2022-02-15: 200 mg via INTRAVENOUS

## 2022-02-15 MED ORDER — ASPIRIN 81 MG PO CHEW
81.0000 mg | CHEWABLE_TABLET | Freq: Every day | ORAL | Status: DC
  Administered 2022-02-16 – 2022-02-17 (×2): 81 mg via ORAL
  Filled 2022-02-15 (×2): qty 1

## 2022-02-15 MED ORDER — ONDANSETRON HCL 4 MG/2ML IJ SOLN
INTRAMUSCULAR | Status: DC | PRN
  Administered 2022-02-15: 4 mg via INTRAVENOUS

## 2022-02-15 NOTE — Progress Notes (Addendum)
STROKE TEAM PROGRESS NOTE   INTERVAL HISTORY Taken as a code IR for worsening syptoms. Stent placed in Left ICA by T. Deveshwar.  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 Plan for ASA and Brilinta after CT. Currently on cangrelor  Vitals:   02/15/22 0300 02/15/22 0400 02/15/22 0600 02/15/22 0855  BP: 91/63 (!) 80/42 109/66   Pulse: 63 (!) 57    Resp: Temp:  98.1 F (36.7 C)  97.8 F (36.6 C)  TempSrc:  Oral    SpO2: 97% 94%    Weight:       CBC:  Recent Labs  Lab 02/13/22 2122 02/13/22 2131  WBC  --  10.5  NEUTROABS  --  6.1  HGB 12.2 9.6*  HCT 36.0 32.3*  MCV  --  68.4*  PLT  --  422*    Basic Metabolic Panel:  Recent Labs  Lab 02/13/22 2122 02/13/22 2131  NA 136 135  K 4.1 3.8  CL 105 103  CO2  --  21*  GLUCOSE 98 141*  BUN 10 9  CREATININE 0.70 0.87  CALCIUM  --  9.3    Lipid Panel:  Recent Labs  Lab 02/14/22 0304  CHOL 203*  TRIG 58  HDL 55  CHOLHDL 3.7  VLDL 12  LDLCALC 161*    HgbA1c:  Recent Labs  Lab 02/13/22 2131  HGBA1C 5.6    Urine Drug Screen:  Recent Labs  Lab 02/14/22 1523  LABOPIA NONE DETECTED  COCAINSCRNUR NONE DETECTED  LABBENZ NONE DETECTED  AMPHETMU NONE DETECTED  THCU NONE DETECTED  LABBARB NONE DETECTED    Alcohol Level  Recent Labs  Lab 02/13/22 2131  ETH <10     IMAGING past 24 hours CT ANGIO NECK W OR WO CONTRAST  Result Date: 02/15/2022 CLINICAL DATA:  51 year old female status post code stroke presentation on 02/13/2022, evidence of left ICA thrombus on CTA. Punctate left hemisphere infarcts on MRI at that time. Recurrent right extremity symptoms, word finding difficulty. EXAM: CT ANGIOGRAPHY NECK TECHNIQUE: Multidetector CT imaging of the neck was performed using the standard protocol during bolus administration of intravenous contrast. Multiplanar CT image reconstructions and MIPs were obtained to evaluate the vascular anatomy. Carotid stenosis measurements (when applicable) are  obtained utilizing NASCET criteria, using the distal internal carotid diameter as the denominator. RADIATION DOSE REDUCTION: This exam was performed according to the departmental dose-optimization program which includes automated exposure control, adjustment of the mA and/or kV according to patient size and/or use of iterative reconstruction technique. CONTRAST:  75mL OMNIPAQUE IOHEXOL 350 MG/ML SOLN COMPARISON:  Brain MRI today.  Recent CTA head and neck 02/13/2022. FINDINGS: Skeleton: No acute osseous abnormality identified. Cervical spine disc and endplate degeneration. Upper chest: Stable, negative. Visible central pulmonary arteries appear patent. Other neck: Stable, negative. Negative visible posterior fossa. Aortic arch: 3 vessel arch configuration. Minimal arch atherosclerosis only in the distal arch. Small prominent possible bronchial artery origin on series 5, image 97 is stable. Right carotid system: Stable and negative. Visible right ICA siphon appears patent and negative. Left carotid system: Negative left CCA origin. Left CCA is within normal limits before the bifurcation. But at the carotid bifurcation elongated low-density filling defect tracking into the left ICA bulb has increased most resembling adherent thrombus (series 5, image 45 versus series 5 image 101 previously, now measuring between 5 and 10 mm and possibly associated with conspicuous low-density plaque or clot at the posterior left ICA origin (  series 5, image 47). There is now some hemodynamically significant stenosis around the clot on series 5, image 45, but distal to that the left ICA is patent without irregularity through the petrous left ICA siphon. Vertebral arteries: Stable and negative. Codominant vertebral arteries without stenosis to the vertebrobasilar junction. Patent PICA origins. Anatomic variants: None in the neck. Review of the MIP images confirms the above findings IMPRESSION: 1. Enlarged thrombus within the Left ICA  origin since the CTA on 02/13/2022. See series 5, image 45. This might be clot adherent to low-density plaque at the posterior left carotid bifurcation. 2. But elsewhere CTA of the Neck remains negative. Study discussed by telephone with Dr. Iver Nestle on 02/15/2022 at 05:55 . Electronically Signed   By: Odessa Fleming M.D.   On: 02/15/2022 05:56   MR BRAIN WO CONTRAST  Result Date: 02/15/2022 CLINICAL DATA:  51 year old female status post code stroke presentation on 02/13/2022, evidence of left ICA thrombus on CTA. Punctate left hemisphere infarcts on MRI at that time. Recurrent right extremity symptoms, word finding difficulty. EXAM: MRI HEAD WITHOUT CONTRAST TECHNIQUE: Multiplanar, multiecho pulse sequences of the brain and surrounding structures were obtained without intravenous contrast. COMPARISON:  Brain MRI 02/13/2022 and earlier. FINDINGS: Brain: Scattered punctate and occasionally patchy foci of restricted diffusion in the left hemisphere are increased in conspicuity since 02/13/2022. Involvement in the left frontal lobe including the anterior and posterior middle frontal gyrus (series 7, image 71), left perirolandic area superiorly (series 5, image 98), left parietal lobe, superior left occipital lobe along the left parietooccipital sulcus (series 5, image 85), and also the left occipital pole (series 5, image 76). Left posterior communicating artery present on the recent CTA. Deep gray nuclei are spared. No contralateral right hemisphere or posterior fossa restricted diffusion identified. Mild T2 and FLAIR hyperintensity in the affected areas (left perirolandic cortex and subcortical white matter series 11, image 21). No acute intracranial hemorrhage identified. Scattered additional nonspecific cerebral white matter T2 and FLAIR hyperintensity appears stable. No chronic cerebral blood products identified on SWI. No midline shift, mass effect, evidence of mass lesion, ventriculomegaly, extra-axial collection.  Cervicomedullary junction and pituitary are within normal limits. Vascular: Major intracranial vascular flow voids are preserved, stable. Skull and upper cervical spine: Negative. Sinuses/Orbits: Stable paranasal sinus mucosal thickening. Orbits appear stable and negative. Other: Mastoids remain clear.  Negative visible scalp and face. IMPRESSION: 1. Increased conspicuity and mild progression of scattered but mostly punctate infarcts in the Left cerebral hemisphere. Involvement in both the Left MCA and PCA territories now (newly including the left occipital pole). 2. No associated hemorrhage or mass effect. No other vascular territories affected. Electronically Signed   By: Odessa Fleming M.D.   On: 02/15/2022 05:07   ECHOCARDIOGRAM COMPLETE  Result Date: 02/14/2022    ECHOCARDIOGRAM REPORT   Patient Name:   Erika Jordan Date of Exam: 02/14/2022 Medical Rec #:  [id number]    Height: Accession #:    [id number]   Weight:       175.4 lb Date of Birth:  09/20/1970    BSA:          1.850 m Patient Age:    51 years     BP:           111/64 mmHg Patient Gender: F            HR:           62 bpm. Exam Location:  Inpatient Procedure: 2D Echo, Cardiac Doppler  and Color Doppler Indications:    Stroke I63.9  History:        Patient has no prior history of Echocardiogram examinations.  Sonographer:    Leta Jungling RDCS Referring Phys: [id number] SRISHTI L BHAGAT IMPRESSIONS  1. Left ventricular ejection fraction, by estimation, is 60 to 65%. The left ventricle has normal function. The left ventricle has no regional wall motion abnormalities. Left ventricular diastolic parameters were normal.  2. Right ventricular systolic function is normal. The right ventricular size is normal.  3. The mitral valve is normal in structure. Trivial mitral valve regurgitation. No evidence of mitral stenosis.  4. The aortic valve is tricuspid. Aortic valve regurgitation is not visualized. No aortic stenosis is present.  5. The inferior vena cava is  normal in size with greater than 50% respiratory variability, suggesting right atrial pressure of 3 mmHg. Comparison(s): No prior Echocardiogram. FINDINGS  Left Ventricle: Left ventricular ejection fraction, by estimation, is 60 to 65%. The left ventricle has normal function. The left ventricle has no regional wall motion abnormalities. The left ventricular internal cavity size was normal in size. There is  no left ventricular hypertrophy. Left ventricular diastolic parameters were normal. Right Ventricle: The right ventricular size is normal. Right ventricular systolic function is normal. Left Atrium: Left atrial size was normal in size. Right Atrium: Right atrial size was normal in size. Pericardium: There is no evidence of pericardial effusion. Mitral Valve: The mitral valve is normal in structure. Trivial mitral valve regurgitation. No evidence of mitral valve stenosis. Tricuspid Valve: The tricuspid valve is normal in structure. Tricuspid valve regurgitation is trivial. No evidence of tricuspid stenosis. Aortic Valve: The aortic valve is tricuspid. Aortic valve regurgitation is not visualized. No aortic stenosis is present. Pulmonic Valve: The pulmonic valve was not well visualized. Pulmonic valve regurgitation is trivial. No evidence of pulmonic stenosis. Aorta: The aortic root is normal in size and structure. Venous: The inferior vena cava is normal in size with greater than 50% respiratory variability, suggesting right atrial pressure of 3 mmHg. IAS/Shunts: The interatrial septum was not well visualized.  LEFT VENTRICLE PLAX 2D LVIDd:         4.60 cm   Diastology LVIDs:         2.10 cm   LV e' medial:    8.76 cm/s LV PW:         0.70 cm   LV E/e' medial:  10.9 LV IVS:        0.70 cm   LV e' lateral:   12.70 cm/s LVOT diam:     1.70 cm   LV E/e' lateral: 7.5 LV SV:         43 LV SV Index:   23 LVOT Area:     2.27 cm  RIGHT VENTRICLE RV S prime:     11.60 cm/s TAPSE (M-mode): 1.7 cm LEFT ATRIUM              Index        RIGHT ATRIUM          Index LA diam:        3.00 cm 1.62 cm/m   RA Area:     8.81 cm LA Vol (A2C):   27.1 ml 14.65 ml/m  RA Volume:   15.50 ml 8.38 ml/m LA Vol (A4C):   32.9 ml 17.78 ml/m LA Biplane Vol: 31.6 ml 17.08 ml/m  AORTIC VALVE LVOT Vmax:   94.20 cm/s LVOT Vmean:  60.600 cm/s LVOT  VTI:    0.188 m  AORTA Ao Root diam: 2.70 cm MITRAL VALVE MV Area (PHT): 3.63 cm    SHUNTS MV Decel Time: 209 msec    Systemic VTI:  0.19 m MV E velocity: 95.40 cm/s  Systemic Diam: 1.70 cm MV A velocity: 61.80 cm/s MV E/A ratio:  1.54 Olga Millers MD Electronically signed by Olga Millers MD Signature Date/Time: 02/14/2022/12:24:48 PM    Final    VAS [place] LOWER EXTREMITY VENOUS (DVT)  Result Date: 02/14/2022  Lower Venous DVT Study Patient Name:  Erika Jordan  Date of Exam:   02/14/2022 Medical Rec #: [id number]     Accession #:    [id number] Date of Birth: 23-Apr-1971     Patient Gender: F Patient Age:   13 years Exam Location:  [facility] Procedure:      VAS [place] LOWER EXTREMITY VENOUS (DVT) Referring Phys: Scheryl Marten Idona Stach --------------------------------------------------------------------------------  Indications: Embolic stroke.  Comparison Study: No prior studies. Performing Technologist: Jean Rosenthal RDMS, RVT  Examination Guidelines: A complete evaluation includes B-mode imaging, spectral Doppler, color Doppler, and power Doppler as needed of all accessible portions of each vessel. Bilateral testing is considered an integral part of a complete examination. Limited examinations for reoccurring indications may be performed as noted. The reflux portion of the exam is performed with the patient in reverse Trendelenburg.  +---------+---------------+---------+-----------+----------+--------------+ RIGHT    CompressibilityPhasicitySpontaneityPropertiesThrombus Aging +---------+---------------+---------+-----------+----------+--------------+ CFV      Full           Yes                                           +---------+---------------+---------+-----------+----------+--------------+ SFJ      Full                                                        +---------+---------------+---------+-----------+----------+--------------+ FV Prox  Full                                                        +---------+---------------+---------+-----------+----------+--------------+ FV Mid   Full                                                        +---------+---------------+---------+-----------+----------+--------------+ FV DistalFull                                                        +---------+---------------+---------+-----------+----------+--------------+ PFV      Full                                                        +---------+---------------+---------+-----------+----------+--------------+  POP      Full           Yes      Yes                                 +---------+---------------+---------+-----------+----------+--------------+ PTV      Full                                                        +---------+---------------+---------+-----------+----------+--------------+ PERO     Full                                                        +---------+---------------+---------+-----------+----------+--------------+ Gastroc  Full                                                        +---------+---------------+---------+-----------+----------+--------------+   +---------+---------------+---------+-----------+----------+--------------+ LEFT     CompressibilityPhasicitySpontaneityPropertiesThrombus Aging +---------+---------------+---------+-----------+----------+--------------+ CFV      Full           Yes      Yes                                 +---------+---------------+---------+-----------+----------+--------------+ SFJ      Full                                                         +---------+---------------+---------+-----------+----------+--------------+ FV Prox  Full                                                        +---------+---------------+---------+-----------+----------+--------------+ FV Mid   Full                                                        +---------+---------------+---------+-----------+----------+--------------+ FV DistalFull                                                        +---------+---------------+---------+-----------+----------+--------------+ PFV      Full                                                        +---------+---------------+---------+-----------+----------+--------------+  POP      Full           Yes      Yes                                 +---------+---------------+---------+-----------+----------+--------------+ PTV      Full                                                        +---------+---------------+---------+-----------+----------+--------------+ PERO     Full                                                        +---------+---------------+---------+-----------+----------+--------------+ Gastroc  Full                                                        +---------+---------------+---------+-----------+----------+--------------+     Summary: RIGHT: - There is no evidence of deep vein thrombosis in the lower extremity.  - No cystic structure found in the popliteal fossa.  LEFT: - There is no evidence of deep vein thrombosis in the lower extremity.  - No cystic structure found in the popliteal fossa.  *See table(s) above for measurements and observations. Electronically signed by Lemar Livings MD on 02/14/2022 at 12:57:14 PM.    Final     PHYSICAL EXAM General:  Alert, well-developed, well-nourished patient in no acute distress Respiratory:  Regular, unlabored respirations on room air  NEURO:  Mental Status: AA&Ox3  Speech/Language: speech is without dysarthria or  aphasia.  Repetition, fluency, and comprehension intact.  Cranial Nerves:  II: PERRL. Visual fields full.  III, IV, VI: EOMI. Eyelids elevate symmetrically.  V: Sensation is intact to light touch and symmetrical to face.  VII: Smile is symmetrical.   VIII: hearing intact to voice. IX, X: Phonation is normal.  JY:NWGNFAOZ shrug 5/5. XII: tongue is midline without fasciculations. Motor: 5/5 strength to all muscle groups tested.  Tone: is normal and bulk is normal Sensation- Intact to light touch bilaterally.  Coordination: Right arm ataxia  Gait- deferred   ASSESSMENT/PLAN Erika Jordan is a 51 y.o. female with history of migraines presenting with sudden onset right arm weakness and aphasia.  She was evaluated in the ED and given TNK to treat possible stroke.  Symptoms have improved, and MRI reveals punctate infarcts in left frontal and parietal lobes.  She does c/o headache, so fioricet was ordered.  Taken as a code IR for worsening symptoms on 5/30 early in the morning. Stent placed in left ICA by Dr. Corliss Skains. 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 Plan for ASA and Brilinta after CT if no hemorrhagic transformation. Currently on cangrelor, will switch to brilinta after repeat CT head.   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 ASPECTS 10.  CTA head & neck filling defect in left ICA concerning for thrombus, no LVO or hemodynamically significant stenosis CT perfusion no core or penumbra CT Head Repeat- No evidence of acute intracranial abnormality.  MRI  punctate infarcts in left frontal and parietal lobes MRI repeat Increased conspicuity and mild progression of scattered but mostly punctate infarcts in the Left MCA and watershed areas.  CTA neck repeat Enlarged thrombus within the Left ICA origin  IR s/p left ICA stenting and  also showed left ICA luminal linear hyperdensity concerning for carotid web. 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 S/p L ICA stenting Likely the source of intraluminal thrombus Continue further work up to rule out other possibility of stroke  Hypotension Home meds:  none Stable on the low end 90s On IVF Will give albumin Q6h x 4 Pt seems to have baseline low BP at home also  Hyperlipidemia Home meds:  none LDL 136, goal < 70 Add rosuvastatin 20 mg daily  Continue statin at discharge  Iron deficiency anemia Hemoglobin 9.6->8.0 Low MCV and MCH Iron level 14, ferritin 5 Put on iron pills  Other Stroke Risk Factors Migraines, 3-4 times per months, lasting 2 days, with nauseous and photophobia, phonophobia - PRN Fioricet.   Left Bell's palsy in 1990s  Other Active Problems Leukocytosis, WBC 11.6  Hospital day # 2  Patient seen and examined by NP/APP with MD. MD to update note as needed.   Erika Pickerevon Shafer, DNP, FNP-BC [facility] Pager: [phone]  ATTENDING NOTE: I reviewed above note and agree with the assessment and plan. Pt was seen and examined.   Husband at bedside.  Patient early this morning had increased right-sided weakness and ataxia, NIH score from 0-4.  Repeat MRI showed increased scattered infarct on the left MCA and watershed areas.  CTA neck repeat showed increased left ICA intraluminal thrombus.  Status post emergent thrombectomy with left ICA stenting.  IR also showed linear hypodensity in the ICA likely carotid web.  It  probably the cause for the intraluminal thrombus and stroke.  Currently on aspirin and Brilinta.  TCD bubble study pending.  LE venous Doppler negative for DVT.  Continue statin.  On exam today, patient mildly lethargic after procedure, however awake alert orientated x3 with psychomotor slowing.  No facial droop, no gaze palsy or visual field deficit.  Moving all extremities, however still has left finger-nose dysmetria.  Patient does have low BP, seem to have baseline low BP.  Continue IV fluid and albumin every 6 hours for 4 doses.  Continue ICU care and monitoring.  Patient looks pale, still has anemia today hemoglobin 8.0.  Check iron level and ferritin level are very low.  We will give iron pills continue monitoring CBC.  For detailed assessment and plan, please refer to above as I have made changes wherever appropriate.   Marvel PlanJindong Bronx Brogden, MD PhD Stroke Neurology 02/15/2022 6:59 PM  This patient is critically ill due to left MCA stroke, left ICA web first with thrombosis, anemia, hypotension, status post ICA stenting and at significant risk of neurological worsening, death form  recurrent stroke, hemorrhagic transformation, bleeding from medication, severe anemia, hypovolemic shock. This patient's care requires constant monitoring of vital signs, hemodynamics, respiratory and cardiac monitoring, review of multiple databases, neurological assessment, discussion with family, other specialists and medical decision making of high complexity. I spent 45 minutes of neurocritical care time in the care of this patient. I had long discussion with patient and family at bedside, updated pt current condition, treatment plan and potential prognosis, and answered all the questions.  They expressed understanding and appreciation.  I also discussed with Dr. Corliss Skains     To contact Stroke Continuity provider, please refer to [email or web address]. After hours, contact General Neurology

## 2022-02-15 NOTE — Anesthesia Postprocedure Evaluation (Signed)
Anesthesia Post Note  Patient: Erika Jordan  Procedure(s) Performed: IR WITH ANESTHESIA     Patient location during evaluation: PACU Anesthesia Type: General Level of consciousness: patient cooperative Pain management: pain level controlled Vital Signs Assessment: post-procedure vital signs reviewed and stable Respiratory status: spontaneous breathing, nonlabored ventilation, respiratory function stable and patient connected to nasal cannula oxygen Cardiovascular status: blood pressure returned to baseline and stable Postop Assessment: no apparent nausea or vomiting Anesthetic complications: no   No notable events documented.  Last Vitals:  Vitals:   02/15/22 1715 02/15/22 1730  BP:  (!) 88/42  Pulse: 82 75  Resp: 16 18  Temp:    SpO2: 96% 99%    Last Pain:  Vitals:   02/15/22 1600  TempSrc: Oral  PainSc:                  [name]

## 2022-02-15 NOTE — Progress Notes (Signed)
Patient ID: Erika Jordan, female   DOB: 1970/11/30, 51 y.o.   MRN: [id number]  Patient's MRI and a repeat CTA was completed, and I was notified by nursing that she was having recurrent symptoms.    On review of imaging, left ICA thrombus previously identified has markedly enlarged and there are new punctate strokes without hemorrhagic conversion in the left ICA distribution.  On examination, generally the patient was comfortable and in no acute distress other than being slightly anxious.  NIH stroke scale was performed and she scored one-point for right upper extremity weakness, one-point for right lower extremity weakness, 2 points for ataxia of the right arm and leg, one-point for mild aphasia.  Nurse at bedside confirms that her NIH was 0 at 4 AM on her last evaluation  Discussed with Dr. Corliss Skains, code IR activated, husband consented at bedside  Brooke Dare MD-PhD [facility] [phone]   CRITICAL CARE Performed by: Gordy Councilman  Total critical care time: 40 minutes  Critical care time was exclusive of separately billable procedures and treating other patients.  Critical care was necessary to treat or prevent imminent or life-threatening deterioration.  Critical care was time spent personally by me on the following activities: development of treatment plan with patient and/or surrogate as well as nursing, discussions with consultants, evaluation of patient's response to treatment, examination of patient, obtaining history from patient or surrogate, ordering and performing treatments and interventions, ordering and review of laboratory studies, ordering and review of radiographic studies, pulse oximetry and re-evaluation of patient's condition.

## 2022-02-15 NOTE — Progress Notes (Addendum)
Patient ID: Erika Jordan, female   DOB: 11-Feb-1971, 51 y.o.   MRN: [id number] INR. 51 year old right-handed female admitted with intermittent right-sided weakness and aphasia.  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  Patient given TNK.  Worsening symptoms early this morning with abnormal right upper extremity movement associated with right-sided weakness and difficulty expressing herself.  Repeat CTA demonstrates worsening preocclusive lobulated filling defect in the proximal left internal carotid artery.  Endovascular treatment of the proximal left internal carotid artery reviewed with the patient's spouse by Dr.Bhagat..  Reasons, potential risks including that of intracranial hemorrhage were reviewed with the spouse who provided informed consent to proceed with the treatment.Fatima Sanger  MD.

## 2022-02-15 NOTE — Sedation Documentation (Signed)
Pt transferred to PACU bay 10 via bed accompanied by RN and CRNA. Remo Lipps at bedside to receive pt. Handoff complete. Right groin remains level 0 with palpable pulses on bilateral PTs. Pt drowsy but follows commands and responds appropriately. No s/s of distress at this time.

## 2022-02-15 NOTE — Anesthesia Procedure Notes (Signed)
Procedure Name: Intubation Date/Time: 02/15/2022 6:43 AM Performed by: Thelma Comp, CRNA Pre-anesthesia Checklist: Patient identified, Emergency Drugs available, Suction available and Patient being monitored Patient Re-evaluated:Patient Re-evaluated prior to induction Oxygen Delivery Method: Circle System Utilized Preoxygenation: Pre-oxygenation with 100% oxygen Induction Type: IV induction Ventilation: Mask ventilation without difficulty Laryngoscope Size: Mac and 3 Grade View: Grade II Tube type: Oral Tube size: 7.0 mm Number of attempts: 1 Airway Equipment and Method: Stylet and Oral airway Placement Confirmation: ETT inserted through vocal cords under direct vision, positive ETCO2 and breath sounds checked- equal and bilateral Secured at: 22 cm Tube secured with: Tape Dental Injury: Teeth and Oropharynx as per pre-operative assessment

## 2022-02-15 NOTE — Sedation Documentation (Signed)
Right groin sheath removed. 8 fr angioseal deployed

## 2022-02-15 NOTE — Progress Notes (Signed)
PT Cancellation Note  Patient Details Name: Erika Jordan MRN: [id number] DOB: 27-Mar-1971   Cancelled Treatment:    Reason Eval/Treat Not Completed: Active bedrest order; noted events and pt s/p bilat carotid arteriograms with revascularization prox L ICA w/stent assisted angioplasty.  Now on bedrest.  Will attempt later today as pt able and schedule permits.    Elray Mcgregor 02/15/2022, 9:49 AM Sheran Lawless, PT [facility] Pager:[phone] Office:[phone] 02/15/2022

## 2022-02-15 NOTE — Progress Notes (Signed)
Pt transferred to MRI and CT at 0400. Pt returned back around 0545. Pt had R arm ataxia and mild expressive aphasia upon arrival back to room. MD notified. Called Code IR. Pt transferred to IR suite at 0620.

## 2022-02-15 NOTE — Progress Notes (Signed)
Referring Physician(s): Dr. Rosalin Hawking  Supervising Physician: Luanne Bras  Patient Status:  [facility] - In-pt  Chief Complaint:  S/p revascularization of proximal left internal carotid artery with stent assisted angioplasty  Subjective:  Pt resting in bed with husband at bedside. She is A&O, calm and pleasant.  She is in no distress. She denies pain, but endorses tenderness to R CFA puncture site.   Allergies: Penicillins  Medications: Prior to Admission medications   Medication Sig Start Date End Date Taking? Authorizing Provider  cetirizine (ZYRTEC) 10 MG tablet Take 10 mg by mouth at bedtime.   Yes [provider]  ibuprofen (ADVIL) 200 MG tablet Take 800 mg by mouth every 6 (six) hours as needed for headache or moderate pain.   Yes [provider]     Vital Signs: BP 104/64   Pulse 69   Temp 97.8 F (36.6 C)   Resp (!) 8   Wt 175 lb 6.4 oz (79.6 kg)   SpO2 98%   Physical Exam Constitutional:      General: She is not in acute distress.    Appearance: Normal appearance. She is not ill-appearing.  HENT:     Head: Normocephalic and atraumatic.     Mouth/Throat:     Comments: L facial droop (pt relates to hx of Bell's Palsy) Eyes:     Extraocular Movements: Extraocular movements intact.     Pupils: Pupils are equal, round, and reactive to light.  Cardiovascular:     Rate and Rhythm: Normal rate and regular rhythm.     Pulses: Normal pulses.  Pulmonary:     Effort: Pulmonary effort is normal. No respiratory distress.  Musculoskeletal:     Right lower leg: No edema.     Left lower leg: No edema.  Skin:    General: Skin is warm and dry.     Comments: R CFA puncture site is soft with no active bleeding and no appreciable pseudoaneurysm. Dressing is C/D/I   Neurological:     Mental Status: She is alert and oriented to person, place, and time.     Comments: Alert, aware and oriented X 3 Speech and comprehension is intact.  PERRLA  bilaterally L facial droop (pt relates to Hx of Bell's Palsy)  Tongue midline Can spontaneously move all 4 extremities. Hand grip strength equal bilaterally. Fine motor and coordination slow but intact.  R distal pulses (DP's) palpable Gait not assessed Romberg not assessed Heel to toe not assessed Distal pulses not assessed     Psychiatric:        Mood and Affect: Mood normal.        Behavior: Behavior normal.        Thought Content: Thought content normal.        Judgment: Judgment normal.    Imaging: CT HEAD WO CONTRAST (5MM)  Result Date: 02/15/2022 CLINICAL DATA:  Stroke, follow up EXAM: CT HEAD WITHOUT CONTRAST TECHNIQUE: Contiguous axial images were obtained from the base of the skull through the vertex without intravenous contrast. RADIATION DOSE REDUCTION: This exam was performed according to the departmental dose-optimization program which includes automated exposure control, adjustment of the mA and/or kV according to patient size and/or use of iterative reconstruction technique. COMPARISON:  Feb 13, 2022. FINDINGS: Brain: No evidence of acute large vascular territory infarction, hemorrhage, hydrocephalus, extra-axial collection or mass lesion/mass effect. Vascular: No definite hyperdense vessel identified. Skull: No acute fracture. Sinuses/Orbits: Moderate paranasal sinus mucosal thickening. Other: No mastoid  effusions. IMPRESSION: No evidence of acute intracranial abnormality. Electronically Signed   By: Margaretha Sheffield M.D.   On: 02/15/2022 11:57   CT ANGIO NECK W OR WO CONTRAST  Result Date: 02/15/2022 CLINICAL DATA:  51 year old female status post code stroke presentation on 02/13/2022, evidence of left ICA thrombus on CTA. Punctate left hemisphere infarcts on MRI at that time. Recurrent right extremity symptoms, word finding difficulty. EXAM: CT ANGIOGRAPHY NECK TECHNIQUE: Multidetector CT imaging of the neck was performed using the standard protocol during bolus  administration of intravenous contrast. Multiplanar CT image reconstructions and MIPs were obtained to evaluate the vascular anatomy. Carotid stenosis measurements (when applicable) are obtained utilizing NASCET criteria, using the distal internal carotid diameter as the denominator. RADIATION DOSE REDUCTION: This exam was performed according to the departmental dose-optimization program which includes automated exposure control, adjustment of the mA and/or kV according to patient size and/or use of iterative reconstruction technique. CONTRAST:  20mL OMNIPAQUE IOHEXOL 350 MG/ML SOLN COMPARISON:  Brain MRI today.  Recent CTA head and neck 02/13/2022. FINDINGS: Skeleton: No acute osseous abnormality identified. Cervical spine disc and endplate degeneration. Upper chest: Stable, negative. Visible central pulmonary arteries appear patent. Other neck: Stable, negative. Negative visible posterior fossa. Aortic arch: 3 vessel arch configuration. Minimal arch atherosclerosis only in the distal arch. Small prominent possible bronchial artery origin on series 5, image 97 is stable. Right carotid system: Stable and negative. Visible right ICA siphon appears patent and negative. Left carotid system: Negative left CCA origin. Left CCA is within normal limits before the bifurcation. But at the carotid bifurcation elongated low-density filling defect tracking into the left ICA bulb has increased most resembling adherent thrombus (series 5, image 45 versus series 5 image 101 previously, now measuring between 5 and 10 mm and possibly associated with conspicuous low-density plaque or clot at the posterior left ICA origin (series 5, image 47). There is now some hemodynamically significant stenosis around the clot on series 5, image 45, but distal to that the left ICA is patent without irregularity through the petrous left ICA siphon. Vertebral arteries: Stable and negative. Codominant vertebral arteries without stenosis to the  vertebrobasilar junction. Patent PICA origins. Anatomic variants: None in the neck. Review of the MIP images confirms the above findings IMPRESSION: 1. Enlarged thrombus within the Left ICA origin since the CTA on 02/13/2022. See series 5, image 45. This might be clot adherent to low-density plaque at the posterior left carotid bifurcation. 2. But elsewhere CTA of the Neck remains negative. Study discussed by telephone with Dr. Curly Shores on 02/15/2022 at 05:55 . Electronically Signed   By: Genevie Ann M.D.   On: 02/15/2022 05:56   MR BRAIN WO CONTRAST  Result Date: 02/15/2022 CLINICAL DATA:  51 year old female status post code stroke presentation on 02/13/2022, evidence of left ICA thrombus on CTA. Punctate left hemisphere infarcts on MRI at that time. Recurrent right extremity symptoms, word finding difficulty. EXAM: MRI HEAD WITHOUT CONTRAST TECHNIQUE: Multiplanar, multiecho pulse sequences of the brain and surrounding structures were obtained without intravenous contrast. COMPARISON:  Brain MRI 02/13/2022 and earlier. FINDINGS: Brain: Scattered punctate and occasionally patchy foci of restricted diffusion in the left hemisphere are increased in conspicuity since 02/13/2022. Involvement in the left frontal lobe including the anterior and posterior middle frontal gyrus (series 7, image 71), left perirolandic area superiorly (series 5, image 98), left parietal lobe, superior left occipital lobe along the left parietooccipital sulcus (series 5, image 85), and also the left occipital pole (  series 5, image 76). Left posterior communicating artery present on the recent CTA. Deep gray nuclei are spared. No contralateral right hemisphere or posterior fossa restricted diffusion identified. Mild T2 and FLAIR hyperintensity in the affected areas (left perirolandic cortex and subcortical white matter series 11, image 21). No acute intracranial hemorrhage identified. Scattered additional nonspecific cerebral white matter T2 and  FLAIR hyperintensity appears stable. No chronic cerebral blood products identified on SWI. No midline shift, mass effect, evidence of mass lesion, ventriculomegaly, extra-axial collection. Cervicomedullary junction and pituitary are within normal limits. Vascular: Major intracranial vascular flow voids are preserved, stable. Skull and upper cervical spine: Negative. Sinuses/Orbits: Stable paranasal sinus mucosal thickening. Orbits appear stable and negative. Other: Mastoids remain clear.  Negative visible scalp and face. IMPRESSION: 1. Increased conspicuity and mild progression of scattered but mostly punctate infarcts in the Left cerebral hemisphere. Involvement in both the Left MCA and PCA territories now (newly including the left occipital pole). 2. No associated hemorrhage or mass effect. No other vascular territories affected. Electronically Signed   By: Genevie Ann M.D.   On: 02/15/2022 05:07   MR BRAIN WO CONTRAST  Result Date: 02/13/2022 CLINICAL DATA:  Right facial droop, right-sided weakness EXAM: MRI HEAD WITHOUT CONTRAST TECHNIQUE: Multiplanar, multiecho pulse sequences of the brain and surrounding structures were obtained without intravenous contrast. COMPARISON:  No prior MRI, correlation is made with 02/13/2022 CT head and CTA head neck FINDINGS: Evaluation is somewhat limited by early termination of the study. No axial T1 sequence was obtained. Brain: Punctate foci of restricted diffusion in the left parietal lobe (series 5, images 79-80) and left frontal lobe (series 5, images 81-85 and 88). No acute hemorrhage, mass, mass effect, or midline shift. No hydrocephalus or extra-axial collection. Scattered T2 hyperintense signal in the periventricular white matter, likely the sequela of chronic small vessel ischemic disease. Vascular: Normal flow voids. Skull and upper cervical spine: Normal marrow signal. Sinuses/Orbits: Mucosal thickening in the left frontal sinus, ethmoid air cells, and maxillary  sinuses. Other: The mastoids are well aerated. 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 Electronically Signed   By: Merilyn Baba M.D.   On: 02/13/2022 23:00   ECHOCARDIOGRAM COMPLETE  Result Date: 02/14/2022    ECHOCARDIOGRAM REPORT   Patient Name:   Erika Jordan Date of Exam: 02/14/2022 Medical Rec #:  [id number]    Height: Accession #:    [id number]   Weight:       175.4 lb Date of Birth:  December 04, 1970    BSA:          1.850 m Patient Age:    83 years     BP:           111/64 mmHg Patient Gender: F            HR:           62 bpm. Exam Location:  Inpatient Procedure: 2D Echo, Cardiac Doppler and Color Doppler Indications:    Stroke I63.9  History:        Patient has no prior history of Echocardiogram examinations.  Sonographer:    Darlina Sicilian RDCS Referring Phys: [id number] [place]  1. Left ventricular ejection fraction, by estimation, is 60 to 65%. The left ventricle has normal function. The left ventricle has no regional wall motion abnormalities. Left ventricular diastolic parameters were normal.  2. Right ventricular systolic function is normal. The right ventricular size is normal.  3. The mitral  valve is normal in structure. Trivial mitral valve regurgitation. No evidence of mitral stenosis.  4. The aortic valve is tricuspid. Aortic valve regurgitation is not visualized. No aortic stenosis is present.  5. The inferior vena cava is normal in size with greater than 50% respiratory variability, suggesting right atrial pressure of 3 mmHg. Comparison(s): No prior Echocardiogram. FINDINGS  Left Ventricle: Left ventricular ejection fraction, by estimation, is 60 to 65%. The left ventricle has normal function. The left ventricle has no regional wall motion abnormalities. The left ventricular internal cavity size was normal in size. There is  no left ventricular hypertrophy. Left ventricular diastolic parameters were normal. Right Ventricle: The right  ventricular size is normal. Right ventricular systolic function is normal. Left Atrium: Left atrial size was normal in size. Right Atrium: Right atrial size was normal in size. Pericardium: There is no evidence of pericardial effusion. Mitral Valve: The mitral valve is normal in structure. Trivial mitral valve regurgitation. No evidence of mitral valve stenosis. Tricuspid Valve: The tricuspid valve is normal in structure. Tricuspid valve regurgitation is trivial. No evidence of tricuspid stenosis. Aortic Valve: The aortic valve is tricuspid. Aortic valve regurgitation is not visualized. No aortic stenosis is present. Pulmonic Valve: The pulmonic valve was not well visualized. Pulmonic valve regurgitation is trivial. No evidence of pulmonic stenosis. Aorta: The aortic root is normal in size and structure. Venous: The inferior vena cava is normal in size with greater than 50% respiratory variability, suggesting right atrial pressure of 3 mmHg. IAS/Shunts: The interatrial septum was not well visualized.  LEFT VENTRICLE PLAX 2D LVIDd:         4.60 cm   Diastology LVIDs:         2.10 cm   LV e' medial:    8.76 cm/s LV PW:         0.70 cm   LV E/e' medial:  10.9 LV IVS:        0.70 cm   LV e' lateral:   12.70 cm/s LVOT diam:     1.70 cm   LV E/e' lateral: 7.5 LV SV:         43 LV SV Index:   23 LVOT Area:     2.27 cm  RIGHT VENTRICLE RV S prime:     11.60 cm/s TAPSE (M-mode): 1.7 cm LEFT ATRIUM             Index        RIGHT ATRIUM          Index LA diam:        3.00 cm 1.62 cm/m   RA Area:     8.81 cm LA Vol (A2C):   27.1 ml 14.65 ml/m  RA Volume:   15.50 ml 8.38 ml/m LA Vol (A4C):   32.9 ml 17.78 ml/m LA Biplane Vol: 31.6 ml 17.08 ml/m  AORTIC VALVE LVOT Vmax:   94.20 cm/s LVOT Vmean:  60.600 cm/s LVOT VTI:    0.188 m  AORTA Ao Root diam: 2.70 cm MITRAL VALVE MV Area (PHT): 3.63 cm    SHUNTS MV Decel Time: 209 msec    Systemic VTI:  0.19 m MV E velocity: 95.40 cm/s  Systemic Diam: 1.70 cm MV A velocity: 61.80  cm/s MV E/A ratio:  1.54 Kirk Ruths MD Electronically signed by Kirk Ruths MD Signature Date/Time: 02/14/2022/12:24:48 PM    Final    CT HEAD CODE STROKE WO CONTRAST  Result Date: 02/13/2022 CLINICAL DATA:  Code stroke.  Stroke suspected EXAM: CT HEAD WITHOUT CONTRAST TECHNIQUE: Contiguous axial images were obtained from the base of the skull through the vertex without intravenous contrast. RADIATION DOSE REDUCTION: This exam was performed according to the departmental dose-optimization program which includes automated exposure control, adjustment of the mA and/or kV according to patient size and/or use of iterative reconstruction technique. COMPARISON:  None Available. FINDINGS: Brain: No evidence of acute infarction, hemorrhage, cerebral edema, mass, mass effect, or midline shift. Ventricles and sulci are normal for age. No extra-axial fluid collection. Vascular: No hyperdense vessel. Skull: Negative for fracture or focal lesion. Sinuses/Orbits: Mucosal thickening in the maxillary sinuses, ethmoid air cells, and left frontal sinus. The orbits are unremarkable. Other: The mastoid air cells are well aerated. ASPECTS [facility] Stroke Program Early CT Score) - Ganglionic level infarction (caudate, lentiform nuclei, internal capsule, insula, M1-M3 cortex): 7 - Supraganglionic infarction (M4-M6 cortex): 3 Total score (0-10 with 10 being normal): 10 IMPRESSION: 1. No acute intracranial process. 2. ASPECTS is 10 Code stroke imaging results were communicated on 02/13/2022 at 9:27 pm to provider BHAGAT via secure text paging. Electronically Signed   By: Merilyn Baba M.D.   On: 02/13/2022 21:28   VAS [place] LOWER EXTREMITY VENOUS (DVT)  Result Date: 02/14/2022  Lower Venous DVT Study Patient Name:  JACINTA GAWRYCH  Date of Exam:   02/14/2022 Medical Rec #: [id number]     Accession #:    [id number] Date of Birth: 05-13-1971     Patient Gender: F Patient Age:   25 years Exam Location:  [facility] Procedure:       VAS [place] LOWER EXTREMITY VENOUS (DVT) Referring Phys: Cornelius Moras XU --------------------------------------------------------------------------------  Indications: Embolic stroke.  Comparison Study: No prior studies. Performing Technologist: Darlin Coco RDMS, RVT  Examination Guidelines: A complete evaluation includes B-mode imaging, spectral Doppler, color Doppler, and power Doppler as needed of all accessible portions of each vessel. Bilateral testing is considered an integral part of a complete examination. Limited examinations for reoccurring indications may be performed as noted. The reflux portion of the exam is performed with the patient in reverse Trendelenburg.  +---------+---------------+---------+-----------+----------+--------------+ RIGHT    CompressibilityPhasicitySpontaneityPropertiesThrombus Aging +---------+---------------+---------+-----------+----------+--------------+ CFV      Full           Yes                                          +---------+---------------+---------+-----------+----------+--------------+ SFJ      Full                                                        +---------+---------------+---------+-----------+----------+--------------+ FV Prox  Full                                                        +---------+---------------+---------+-----------+----------+--------------+ FV Mid   Full                                                        +---------+---------------+---------+-----------+----------+--------------+  FV DistalFull                                                        +---------+---------------+---------+-----------+----------+--------------+ PFV      Full                                                        +---------+---------------+---------+-----------+----------+--------------+ POP      Full           Yes      Yes                                  +---------+---------------+---------+-----------+----------+--------------+ PTV      Full                                                        +---------+---------------+---------+-----------+----------+--------------+ PERO     Full                                                        +---------+---------------+---------+-----------+----------+--------------+ Gastroc  Full                                                        +---------+---------------+---------+-----------+----------+--------------+   +---------+---------------+---------+-----------+----------+--------------+ LEFT     CompressibilityPhasicitySpontaneityPropertiesThrombus Aging +---------+---------------+---------+-----------+----------+--------------+ CFV      Full           Yes      Yes                                 +---------+---------------+---------+-----------+----------+--------------+ SFJ      Full                                                        +---------+---------------+---------+-----------+----------+--------------+ FV Prox  Full                                                        +---------+---------------+---------+-----------+----------+--------------+ FV Mid   Full                                                        +---------+---------------+---------+-----------+----------+--------------+  FV DistalFull                                                        +---------+---------------+---------+-----------+----------+--------------+ PFV      Full                                                        +---------+---------------+---------+-----------+----------+--------------+ POP      Full           Yes      Yes                                 +---------+---------------+---------+-----------+----------+--------------+ PTV      Full                                                         +---------+---------------+---------+-----------+----------+--------------+ PERO     Full                                                        +---------+---------------+---------+-----------+----------+--------------+ Gastroc  Full                                                        +---------+---------------+---------+-----------+----------+--------------+     Summary: RIGHT: - There is no evidence of deep vein thrombosis in the lower extremity.  - No cystic structure found in the popliteal fossa.  LEFT: - There is no evidence of deep vein thrombosis in the lower extremity.  - No cystic structure found in the popliteal fossa.  *See table(s) above for measurements and observations. Electronically signed by Servando Snare MD on 02/14/2022 at 12:57:14 PM.    Final    CT ANGIO HEAD NECK W WO CM W PERF (CODE STROKE)  Result Date: 02/13/2022 CLINICAL DATA:  Right facial droop, right-sided weakness, stroke suspected EXAM: CT ANGIOGRAPHY HEAD AND NECK CT PERFUSION TECHNIQUE: Multidetector CT imaging of the head and neck was performed using the standard protocol during bolus administration of intravenous contrast. Multiplanar CT image reconstructions and MIPs were obtained to evaluate the vascular anatomy. Carotid stenosis measurements (when applicable) are obtained utilizing NASCET criteria, using the distal internal carotid diameter as the denominator. Multiphase CT imaging of the brain was performed following IV bolus contrast injection. Subsequent parametric perfusion maps were calculated using RAPID software. RADIATION DOSE REDUCTION: This exam was performed according to the departmental dose-optimization program which includes automated exposure control, adjustment of the mA and/or kV according to patient size and/or use of iterative reconstruction technique. CONTRAST:  100 mL Omnipaque 350 COMPARISON:  No prior CTA, correlation is made with  CT head 02/13/2022 FINDINGS: CT HEAD FINDINGS For  noncontrast findings, please see same day CT head. CTA NECK FINDINGS Aortic arch: Standard branching. Imaged portion shows no evidence of aneurysm or dissection. No significant stenosis of the major arch vessel origins. Right carotid system: No evidence of dissection, occlusion, or hemodynamically significant stenosis (greater than 50%). Left carotid system: Filling defect along the medial aspect of the proximal left ICA, extending into the lumen (series 7, images 202-204), which may represent intraluminal thrombus. No evidence of dissection, occlusion, or hemodynamically significant stenosis (greater than 50%). Vertebral arteries: No evidence of dissection, occlusion, or hemodynamically significant stenosis (greater than 50%). Skeleton: No acute osseous abnormality. Other neck: No acute finding. Upper chest: No focal pulmonary opacity or pleural effusion. Review of the MIP images confirms the above findings CTA HEAD FINDINGS Anterior circulation: Both internal carotid arteries are patent to the termini, without significant stenosis. A1 segments patent. Normal anterior communicating artery. Anterior cerebral arteries are patent to their distal aspects. No M1 stenosis or occlusion, with irregularity of the left-greater-than-right M1 normal MCA bifurcations. Distal MCA branches perfused and symmetric. Posterior circulation: Vertebral arteries patent to the vertebrobasilar junction without stenosis. Posterior inferior cerebellar arteries patent proximally. Basilar patent to its distal aspect. Superior cerebellar arteries patent proximally. Patent P1 segments. PCAs perfused to their distal aspects without stenosis. The right posterior communicating artery is patent. Venous sinuses: As permitted by contrast timing, patent. Anatomic variants: None significant. Review of the MIP images confirms the above findings CT Brain Perfusion Findings: ASPECTS: 10 CBF (<30%) Volume: 77mL Perfusion (Tmax>6.0s) volume: 54mL Mismatch  Volume: 39mL Infarction Location:None IMPRESSION: 1. Filling defect in the proximal left ICA, concerning for intraluminal thrombus. No hemodynamically significant stenosis in the neck. 2.  No intracranial large vessel occlusion or significant stenosis. 3. No infarct core or penumbra on CT perfusion. Code stroke imaging results were communicated on 02/13/2022 at 10:06 pm to provider BHAGAT via secure text paging. Electronically Signed   By: Merilyn Baba M.D.   On: 02/13/2022 22:07    Labs:  CBC: Recent Labs    02/13/22 2122 02/13/22 2131 02/15/22 1336  WBC  --  10.5 11.6*  HGB 12.2 9.6* 8.0*  HCT 36.0 32.3* 27.7*  PLT  --  422* 337    COAGS: Recent Labs    02/13/22 2131  INR 1.0  APTT 22*    BMP: Recent Labs    02/13/22 2122 02/13/22 2131 02/15/22 1336  NA 136 135 136  K 4.1 3.8 4.4  CL 105 103 107  CO2  --  21* 22  GLUCOSE 98 141* 149*  BUN 10 9 7   CALCIUM  --  9.3 7.7*  CREATININE 0.70 0.87 0.77  GFRNONAA  --  >60 >60    LIVER FUNCTION TESTS: Recent Labs    02/13/22 2131  BILITOT 0.5  AST 21  ALT 18  ALKPHOS 91  PROT 7.1  ALBUMIN 3.4*    Assessment and Plan:  S/p revascularization of proximal left internal carotid artery with stent assisted angioplasty.  Pt resting in bed with husband at bedside. She is A&O, calm and pleasant.  She is in no distress.  R CFA puncture site is soft with no active bleeding and no appreciable pseudoaneurysm. Dressing is C/D/I   Alert, aware and oriented X 3 Speech and comprehension is intact.  PERRLA bilaterally L facial droop (pt relates to Hx of Bell's Palsy)  Tongue midline Can spontaneously move all 4 extremities. Hand grip strength  equal bilaterally. Fine motor and coordination slow but intact.  R distal pulses (DP's) palpable   NIR/Neurology to follow patient.   Please call with questions/concerns.    Electronically Signed: Tyson Alias, NP 02/15/2022, 2:36 PM   I spent a total of 15 Minutes at the the  patient's bedside AND on the patient's hospital floor or unit, greater than 50% of which was counseling/coordinating care for S/p revascularization of proximal left internal carotid artery with stent assisted angioplasty.

## 2022-02-15 NOTE — Transfer of Care (Signed)
Immediate Anesthesia Transfer of Care Note  Patient: Erika Jordan  Procedure(s) Performed: IR WITH ANESTHESIA  Patient Location: PACU  Anesthesia Type:General  Level of Consciousness: awake, alert  and patient cooperative  Airway & Oxygen Therapy: Patient Spontanous Breathing  Post-op Assessment: Report given to RN and Post -op Vital signs reviewed and stable  Post vital signs: Reviewed and stable  Last Vitals:  Vitals Value Taken Time  BP 135/75 02/15/22 0858  Temp    Pulse 58 02/15/22 0858  Resp 21 02/15/22 0859  SpO2 100 % 02/15/22 0858  Vitals shown include unvalidated device data.  Last Pain:  Vitals:   02/15/22 0600  TempSrc:   PainSc: 4          Complications: No notable events documented.

## 2022-02-15 NOTE — Procedures (Signed)
INR. Bilateral common carotid arteriograms.  Right CFA approach.  Findings.   Preocclusive lobulated clot in the left internal carotid artery proximally at the bulb associated with a linear hyperdensity at the bulb. Procedure.  Status post revascularization of proximal left internal carotid artery with stent assisted angioplasty,  with distal protection, and proximal flow arrest. No occlusion or filling defects noted in the left MCA and left anterior cerebral circulations. Post CT of the brain demonstrates no acute intracranial hemorrhage.  8 French Angio-Seal applied at the right groin puncture site.  Bolus dose of IVcangrelor given at 7:35 AM.  Low-dose infusion started for 4 hours to terminate at 11:35 AM.  CT of the brain ordered for 11:35 AM.  Patient extubated. Pupils 2 mm RT= LT. No facial asymmetry. Tongue midline. Moves all 4s equally.  Distal pulses palpable posterior tibials bilaterally.  Dorsalis pedis not palpable bilaterally unchanged from prior to the procedure.  Fatima Sanger MD

## 2022-02-15 NOTE — Anesthesia Preprocedure Evaluation (Signed)
Anesthesia Evaluation  Patient identified by MRN, date of birth, ID band Patient confused    Reviewed: Patient's Chart, lab work & pertinent test results, Unable to perform ROS - Chart review only  Airway Mallampati: II  TM Distance: >3 FB Neck ROM: Full    Dental no notable dental hx.    Pulmonary neg pulmonary ROS,    Pulmonary exam normal breath sounds clear to auscultation       Cardiovascular hypertension, Pt. on medications Normal cardiovascular exam Rhythm:Regular Rate:Normal  Undiagnosed HTN   Neuro/Psych CVA negative psych ROS   GI/Hepatic negative GI ROS, Neg liver ROS,   Endo/Other  negative endocrine ROS  Renal/GU negative Renal ROS  negative genitourinary   Musculoskeletal negative musculoskeletal ROS (+)   Abdominal   Peds negative pediatric ROS (+)  Hematology negative hematology ROS (+)   Anesthesia Other Findings   Reproductive/Obstetrics negative OB ROS                             Anesthesia Physical Anesthesia Plan  ASA: 3 and emergent  Anesthesia Plan: General   Post-op Pain Management: Minimal or no pain anticipated   Induction: Intravenous  PONV Risk Score and Plan: 3 and Ondansetron, Dexamethasone and Treatment may vary due to age or medical condition  Airway Management Planned: Oral ETT  Additional Equipment:   Intra-op Plan:   Post-operative Plan: Extubation in OR  Informed Consent: I have reviewed the patients History and Physical, chart, labs and discussed the procedure including the risks, benefits and alternatives for the proposed anesthesia with the patient or authorized representative who has indicated his/her understanding and acceptance.     Dental advisory given  Plan Discussed with: CRNA and Surgeon  Anesthesia Plan Comments:         Anesthesia Quick Evaluation

## 2022-02-16 ENCOUNTER — Encounter (HOSPITAL_COMMUNITY): Payer: Self-pay | Admitting: Interventional Radiology

## 2022-02-16 ENCOUNTER — Inpatient Hospital Stay (HOSPITAL_COMMUNITY): Payer: No Typology Code available for payment source

## 2022-02-16 DIAGNOSIS — I639 Cerebral infarction, unspecified: Secondary | ICD-10-CM

## 2022-02-16 DIAGNOSIS — D649 Anemia, unspecified: Secondary | ICD-10-CM

## 2022-02-16 DIAGNOSIS — I959 Hypotension, unspecified: Secondary | ICD-10-CM

## 2022-02-16 LAB — BASIC METABOLIC PANEL
Anion gap: 5 (ref 5–15)
BUN: 5 mg/dL — ABNORMAL LOW (ref 6–20)
CO2: 21 mmol/L — ABNORMAL LOW (ref 22–32)
Calcium: 7.5 mg/dL — ABNORMAL LOW (ref 8.9–10.3)
Chloride: 109 mmol/L (ref 98–111)
Creatinine, Ser: 0.75 mg/dL (ref 0.44–1.00)
GFR, Estimated: >60 mL/min (ref >60)
Glucose, Bld: 114 mg/dL — ABNORMAL HIGH (ref 70–99)
Potassium: 3.7 mmol/L (ref 3.5–5.1)
Sodium: 135 mmol/L (ref 135–145)

## 2022-02-16 LAB — CARDIOLIPIN ANTIBODIES, IGG, IGM, IGA
Anticardiolipin IgA: <9 APL U/mL (ref 0–11)
Anticardiolipin IgG: <9 GPL U/mL (ref 0–14)
Anticardiolipin IgM: <9 MPL U/mL (ref 0–12)

## 2022-02-16 LAB — CBC WITH DIFFERENTIAL/PLATELET
Abs Immature Granulocytes: 0.05 10*3/uL (ref 0.00–0.07)
Basophils Absolute: 0 10*3/uL (ref 0.0–0.1)
Basophils Relative: 0 %
Eosinophils Absolute: 0.2 10*3/uL (ref 0.0–0.5)
Eosinophils Relative: 2 %
HCT: 21.6 % — ABNORMAL LOW (ref 36.0–46.0)
Hemoglobin: 6.3 g/dL — CL (ref 12.0–15.0)
Immature Granulocytes: 1 %
Lymphocytes Relative: 17 %
Lymphs Abs: 1.5 10*3/uL (ref 0.7–4.0)
MCH: 20.1 pg — ABNORMAL LOW (ref 26.0–34.0)
MCHC: 29.2 g/dL — ABNORMAL LOW (ref 30.0–36.0)
MCV: 68.8 fL — ABNORMAL LOW (ref 80.0–100.0)
Monocytes Absolute: 0.4 10*3/uL (ref 0.1–1.0)
Monocytes Relative: 4 %
Neutro Abs: 6.9 10*3/uL (ref 1.7–7.7)
Neutrophils Relative %: 76 %
Platelets: 245 10*3/uL (ref 150–400)
RBC: 3.14 MIL/uL — ABNORMAL LOW (ref 3.87–5.11)
RDW: 17.2 % — ABNORMAL HIGH (ref 11.5–15.5)
WBC: 9 10*3/uL (ref 4.0–10.5)
nRBC: 0 % (ref 0.0–0.2)

## 2022-02-16 LAB — BETA-2-GLYCOPROTEIN I ABS, IGG/M/A
Beta-2 Glyco I IgG: <9 GPI IgG units (ref 0–20)
Beta-2-Glycoprotein I IgA: <9 GPI IgA units (ref 0–25)
Beta-2-Glycoprotein I IgM: <9 GPI IgM units (ref 0–32)

## 2022-02-16 LAB — ABO/RH: ABO/RH(D): B POS

## 2022-02-16 LAB — HEMOGLOBIN AND HEMATOCRIT, BLOOD
HCT: 31.4 % — ABNORMAL LOW (ref 36.0–46.0)
Hemoglobin: 9.7 g/dL — ABNORMAL LOW (ref 12.0–15.0)

## 2022-02-16 LAB — HOMOCYSTEINE: Homocysteine: 8.3 umol/L (ref 0.0–14.5)

## 2022-02-16 LAB — PREPARE RBC (CROSSMATCH)

## 2022-02-16 IMAGING — CT CT ABD-PELV W/O CM
2 of 4 series · 16 of 46 positions shown, 18 images · non-contrast
Comparison: None

CLINICAL DATA: Abdominal pain.  Stroke with revascularization.



[Series 3: abd/ pelvis 5.0 i30f 2 · axial · 0.84mm/px · z∈[-965,-505]mm · 13 of 100 slices shown, 15 images]
[im 4/100  soft-tissue]
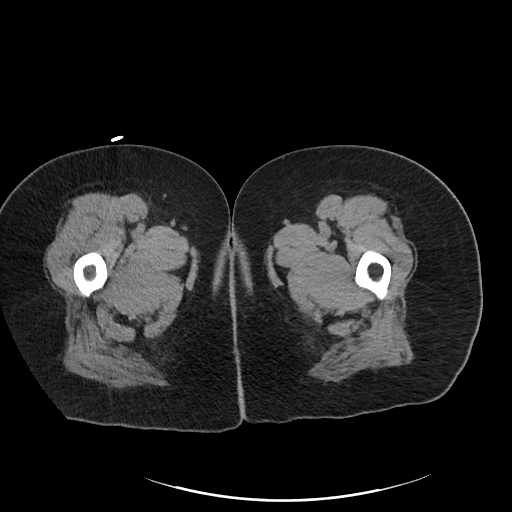
[im 4/100  bone]
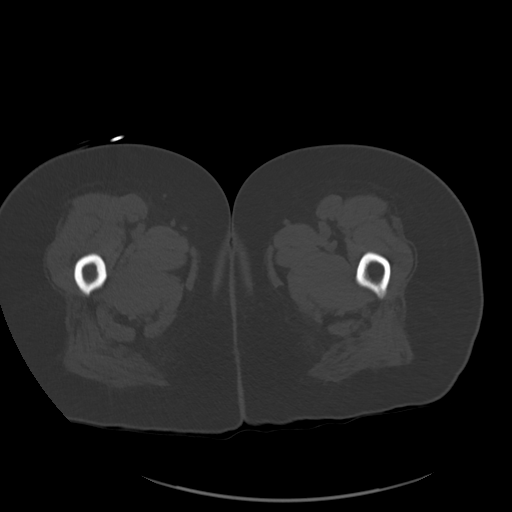
[im 12/100  soft-tissue]
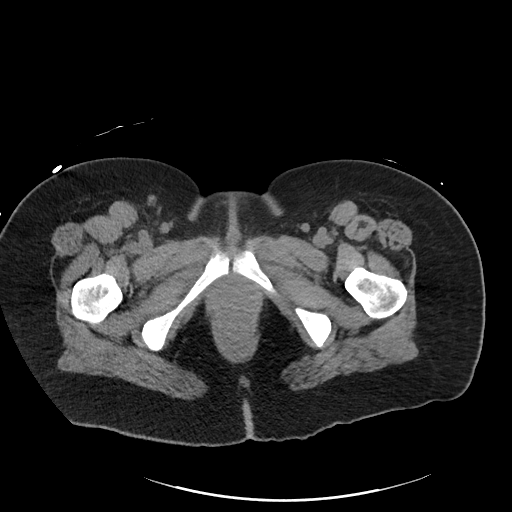
[im 20/100  soft-tissue]
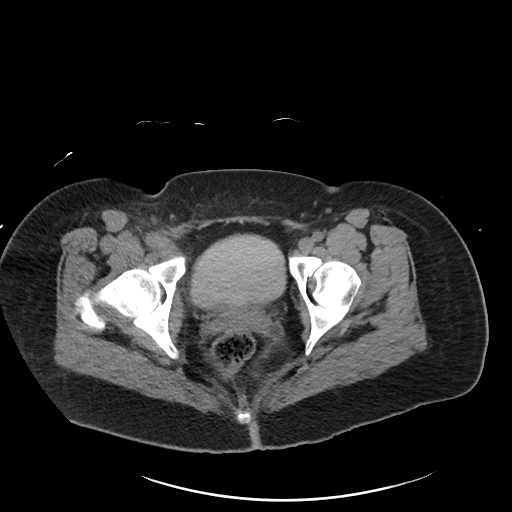
[im 28/100  soft-tissue]
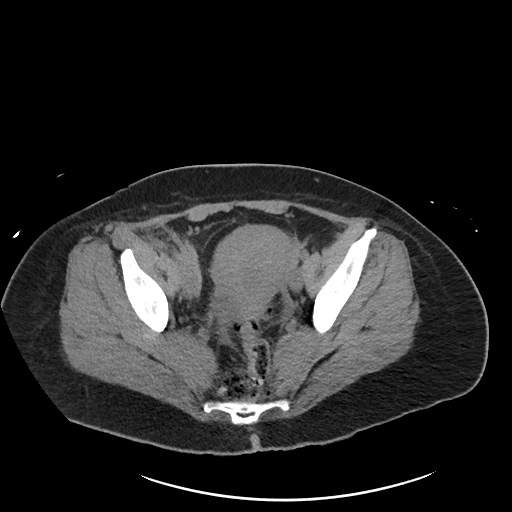
[im 36/100  soft-tissue]
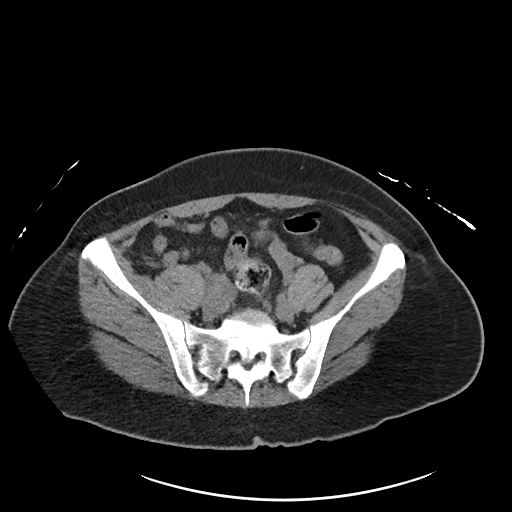
[im 44/100  soft-tissue]
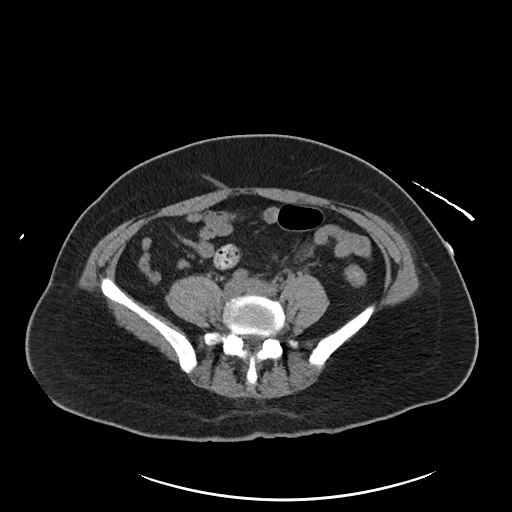
[im 52/100  soft-tissue]
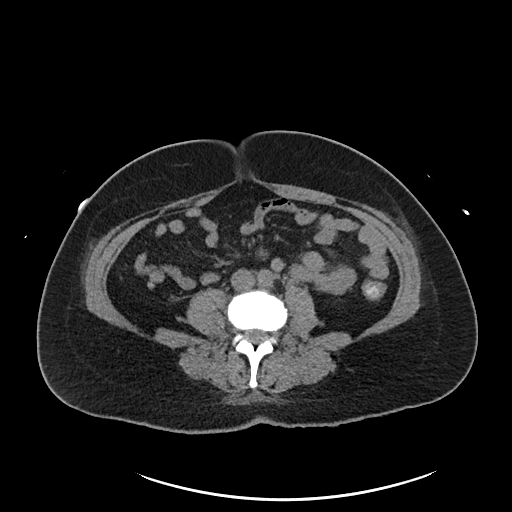
[im 56/100  soft-tissue]
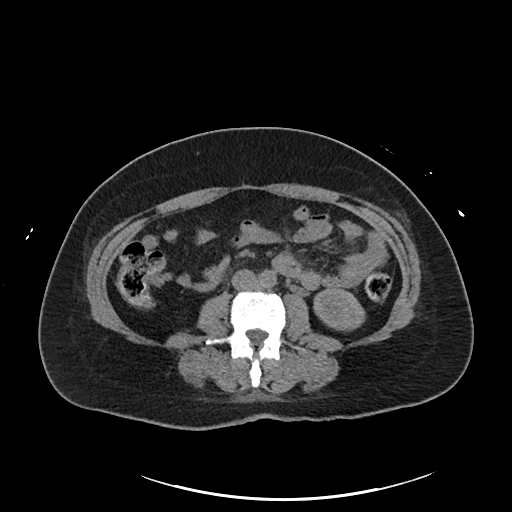
[im 64/100  soft-tissue]
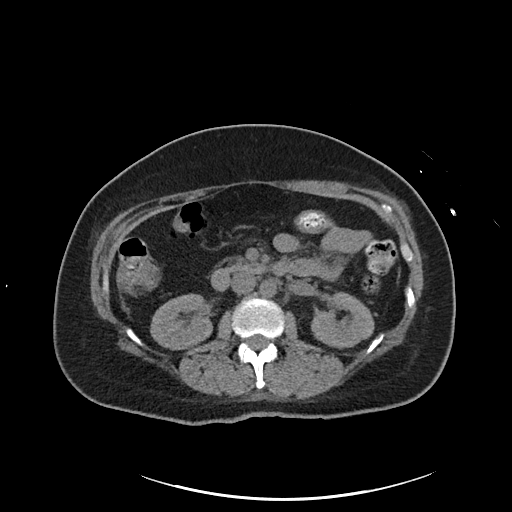
[im 64/100  bone]
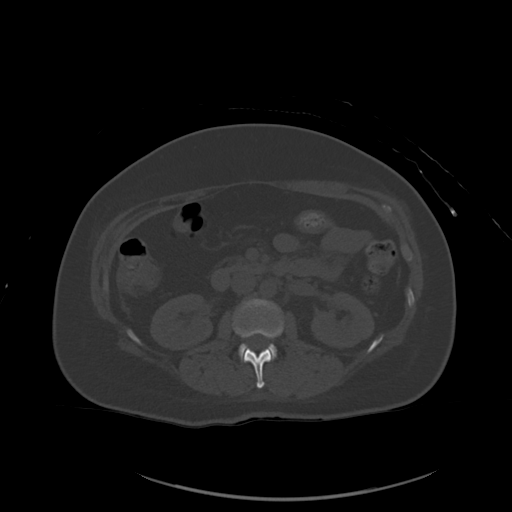
[im 72/100  soft-tissue]
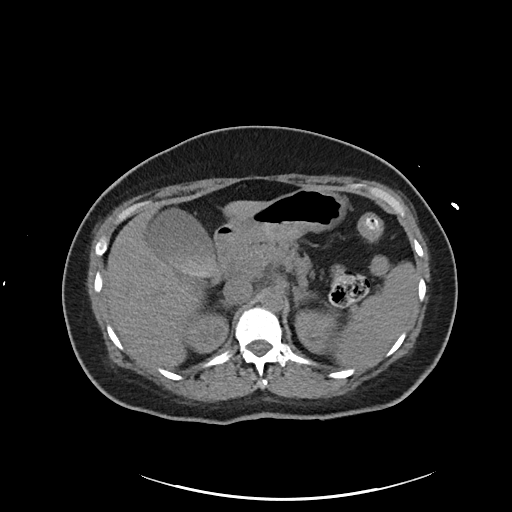
[im 80/100  soft-tissue]
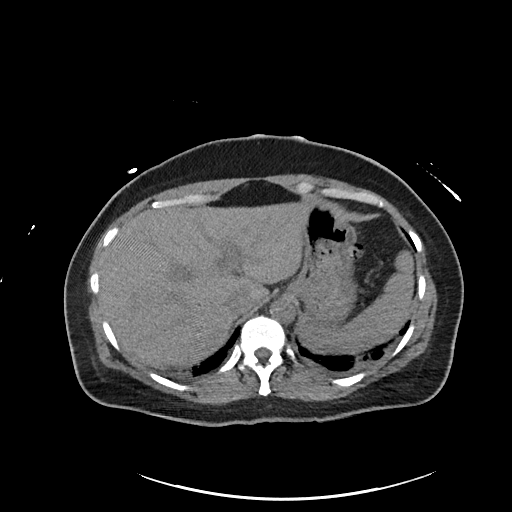
[im 88/100  soft-tissue]
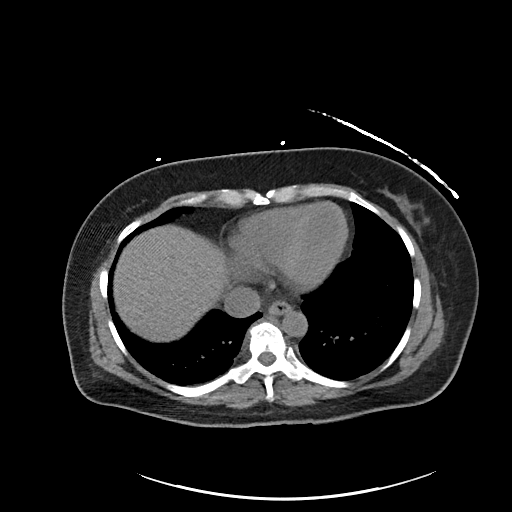
[im 96/100  soft-tissue]
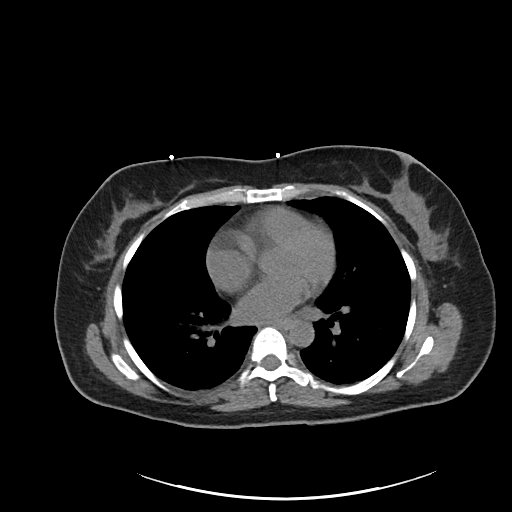

[Series 6: cor st · coronal · 0.83mm/px · 3 of 102 slices shown]
[im 34/102  soft-tissue]
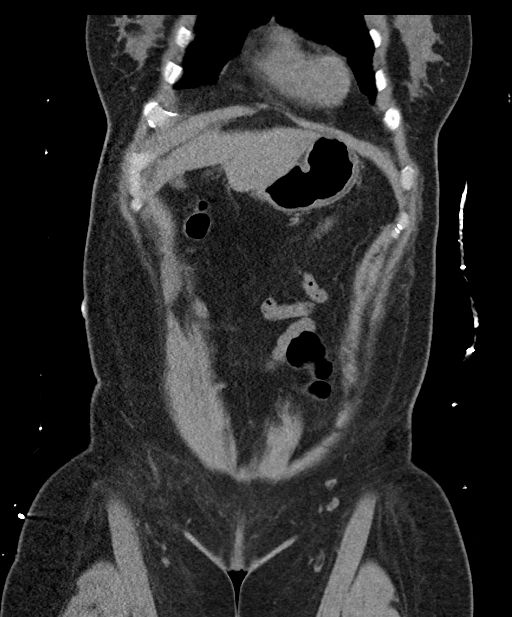
[im 45/102  soft-tissue]
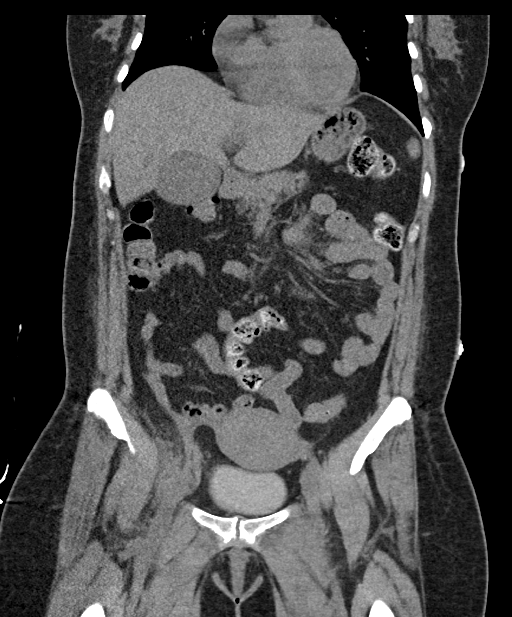
[im 57/102  soft-tissue]
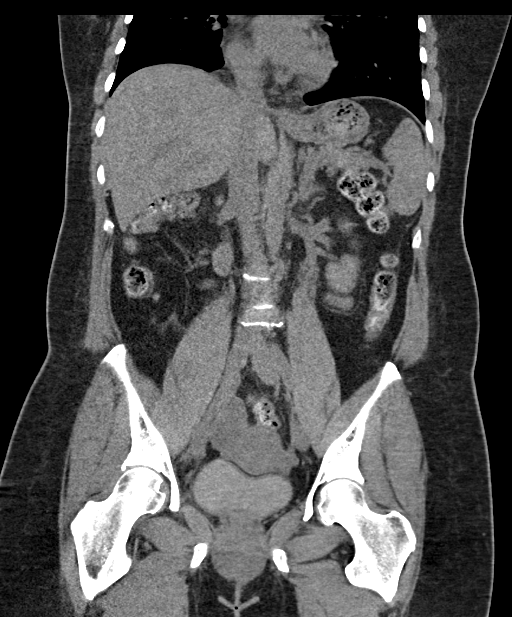

[16 of 46 positions shown; findings below may reference images not displayed]

FINDINGS: Lower chest: Mild atelectasis at the lung bases.  No lobar collapse.

Hepatobiliary: Normal without contrast. Vicarious excretion of
contrast dependent in the gallbladder. No discrete stone.

Pancreas: Normal

Spleen: Normal

Adrenals/Urinary Tract: Adrenal glands are normal. Kidneys are
normal. Bladder is normal, containing some excreted contrast.

Stomach/Bowel: Stomach and small intestine are normal. Appendix is
normal. No colon pathology.

Vascular/Lymphatic: Aorta and IVC are normal. No adenopathy. There
is a small amount of extravascular blood along the right iliac
vessels within the pelvic retroperitoneum. No sizable discrete
hematoma.

Reproductive: Normal

Other: No free fluid or air.

Musculoskeletal: Normal
IMPRESSION: Small amount of extravascular extraperitoneal blood along the right
iliac region without evidence of a large postprocedural hematoma.
Otherwise negative.

## 2022-02-16 MED ORDER — SODIUM CHLORIDE 0.9% IV SOLUTION: Freq: Once | INTRAVENOUS | Status: AC

## 2022-02-16 MED ORDER — DOCUSATE SODIUM 100 MG PO CAPS
100.0000 mg | ORAL_CAPSULE | Freq: Two times a day (BID) | ORAL | Status: DC
  Administered 2022-02-16 – 2022-02-17 (×3): 100 mg via ORAL
  Filled 2022-02-16 (×3): qty 1

## 2022-02-16 NOTE — Progress Notes (Signed)
Physical Therapy Treatment Patient Details Name: Erika Jordan MRN: [id number] DOB: [birth year] Today's Date: 02/16/2022   History of Present Illness Erika Jordan is a 51 y.o. female with history of migraines admitted right arm weakness and aphasia.  She was evaluated in the ED and given TNK to treat possible stroke.  Symptoms have improved, and MRI reveals punctate infarcts in left frontal and parietal lobes.  She does c/o headache, so fioricet was ordered.  CTA shows possible thrombus in left ICA, plans for follow up testing.  Underwent IR L ICA stent assisted angioplasty due to worsening symptoms on 02/15/22.    PT Comments    Patient able to tolerated in room ambulation this session despite still working on first unit of blood with low hemoglobin this morning.  She toileted and washed her hands with S and seemed more confident with ambulation than last session despite having symptoms requiring L ICA stent placement yesterday.  PT will continue to follow acutely.  Likely no follow up PT needs at d/c.    Recommendations for follow up therapy are one component of a multi-disciplinary discharge planning process, led by the attending physician.  Recommendations may be updated based on patient status, additional functional criteria and insurance authorization.  Follow Up Recommendations  No PT follow up     Assistance Recommended at Discharge Intermittent Supervision/Assistance  Patient can return home with the following A little help with walking and/or transfers;Assistance with cooking/housework;Assist for transportation   Equipment Recommendations  None recommended by PT    Recommendations for Other Services       Precautions / Restrictions Precautions Precautions: Fall     Mobility  Bed Mobility Overal bed mobility: Needs Assistance Bed Mobility: Supine to Sit, Sit to Supine     Supine to sit: Supervision, HOB elevated Sit to supine: Supervision   General bed mobility  comments: increased time due to lines    Transfers Overall transfer level: Needs assistance Equipment used: None Transfers: Sit to/from Stand Sit to Stand: Min guard           General transfer comment: assist for balance and lines    Ambulation/Gait Ambulation/Gait assistance: Min guard Gait Distance (Feet): 12 Feet (& 30' in room) Assistive device: None Gait Pattern/deviations: Step-through pattern, Decreased stride length       General Gait Details: walked to bathroom and to door and back x 3 with slower pace and some instability but no LOB   Stairs             Wheelchair Mobility    Modified Rankin (Stroke Patients Only) Modified Rankin (Stroke Patients Only) Pre-Morbid Rankin Score: No significant disability Modified Rankin: Moderately severe disability     Balance Overall balance assessment: Needs assistance   Sitting balance-Leahy Scale: Good       Standing balance-Leahy Scale: Fair Standing balance comment: toileted and washed hands with S for safety and lines                            Cognition Arousal/Alertness: Awake/alert Behavior During Therapy: Flat affect Overall Cognitive Status: Within Functional Limits for tasks assessed                                          Exercises      General Comments General comments (skin integrity, edema, etc.):  VSS with orthostatics entered per RN; noted L IV access site bleeding while blood running, RN in to apply dressing      Pertinent Vitals/Pain Pain Assessment Pain Assessment: Faces Faces Pain Scale: Hurts little more Pain Location: tenderness at femoral access site from angioplasty Pain Descriptors / Indicators: Sore, Tender Pain Intervention(s): Monitored during session    Home Living                          Prior Function            PT Goals (current goals can now be found in the care plan section) Progress towards PT goals: Progressing  toward goals    Frequency    Min 4X/week      PT Plan Current plan remains appropriate    Co-evaluation              AM-PAC PT "6 Clicks" Mobility   Outcome Measure  Help needed turning from your back to your side while in a flat bed without using bedrails?: None Help needed moving from lying on your back to sitting on the side of a flat bed without using bedrails?: None Help needed moving to and from a bed to a chair (including a wheelchair)?: None Help needed standing up from a chair using your arms (e.g., wheelchair or bedside chair)?: A Little Help needed to walk in hospital room?: A Little Help needed climbing 3-5 steps with a railing? : Total 6 Click Score: 19    End of Session Equipment Utilized During Treatment: Gait belt Activity Tolerance: Treatment limited secondary to medical complications (Comment) (still with low hemoglobin getting blood and possible retroperitoneal bleed) Patient left: in bed;with call bell/phone within reach   PT Visit Diagnosis: Other abnormalities of gait and mobility (R26.89)     Time: 6834-1962 PT Time Calculation (min) (ACUTE ONLY): 20 min  Charges:  $Gait Training: 8-22 mins                     Sheran Lawless, PT Acute Rehabilitation Services [phone] [phone] 02/16/2022    Elray Mcgregor 02/16/2022, 2:03 PM

## 2022-02-16 NOTE — Progress Notes (Signed)
Speech Language Pathology Treatment: Cognitive-Linquistic  Patient Details Name: Erika Jordan MRN: [id number] DOB: 09/21/1970 Today's Date: 02/16/2022 Time: 2010-0712 SLP Time Calculation (min) (ACUTE ONLY): 14 min  Assessment / Plan / Recommendation Clinical Impression  Pt was seen for treatment with her husband present. Both parties reported that they believe the pt's language and cognition are back to baseline. Pt was evaluated with subtests of the Quick Aphasia Battery and the [facility] Mental Status Examination was completed to evaluate the pt's cognitive-linguistic skills. She achieved a score of 28/30 which is below the normal limits of 27 or more out of 30 and is within the normal limits of 27 or more out of 30. Two points were lost with for the hands on the clock which pt corrected with min cues. No speech or language deficits were noted and her performance on informal cognitive-linguistic tasks was also within functional limits. Further skilled SLP services are not clinically indicated at this time. Pt and her husband were educated regarding results and recommendations and both parties verbalized understanding as well as agreement with plan of care.   HPI HPI: 51 y.o. left-handed woman with a past medical history significant for migraine headaches (not on any medications), presented with headache with right-sided weakness and difficulty speaking/confusion. 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 Pt is from [place], [place], and is in [place] visiting her daughter.      SLP Plan  Discharge SLP treatment due to (comment);All goals met      Recommendations for follow up therapy are one component of a multi-disciplinary discharge planning process, led by the attending physician.  Recommendations may be updated based on patient status, additional functional criteria and insurance authorization.    Recommendations                    Follow Up Recommendations: No SLP follow up Assistance recommended at discharge: None SLP Visit Diagnosis: Aphasia (R47.01) Plan: Discharge SLP treatment due to (comment);All goals met         Lorrayne Ismael I. Hardin Negus, [place], [place] Office number [phone] Pager Lead  02/16/2022, 3:53 PM

## 2022-02-16 NOTE — Progress Notes (Signed)
OT Cancellation Note  Patient Details Name: Erika Jordan MRN: [id number] DOB: Nov 17, 1970   Cancelled Treatment:    Reason Eval/Treat Not Completed: Fatigue/lethargy limiting ability to participate;Other (comment) pt asleep upon arrival, will check back as time allows for OT session.   Lenor Derrick., COTA/L Acute Rehabilitation Services [phone]   Barron Schmid 02/16/2022, 2:02 PM

## 2022-02-16 NOTE — Progress Notes (Signed)
TCD Bubble study completed with Dr. Erlinda Hong. Refer to "CV Proc" under chart review to view preliminary results.  02/16/2022 3:34 PM Kelby Aline., MHA, RVT, RDCS, RDMS

## 2022-02-16 NOTE — Progress Notes (Signed)
Chief Complaint: Patient was seen today for follow up (L)ICA stent  Referring Physician(s): Dr. Roda Shutters  Supervising Physician: Julieanne Cotton  Patient Status: [facility] - In-pt  Subjective: Status post revascularization of proximal left internal carotid artery with stent assisted angioplasty. Feels well, states 1 episode N/V last pm. Denies HA. Some back soreness from laying in bed, no abd pain. Hgb drifting down and BP has been a little soft. Denies hematemesis or blood in stool at home. Hasn't had BM yet. Hx of Iron def anemia, started on po Fe. Husband at bedside  Objective: Physical Exam: BP 111/63   Pulse 83   Temp 98.2 F (36.8 C) (Oral)   Resp (!) 23   Wt 79.6 kg   SpO2 98%  A&O x 3 PERRLA, EOMI Face symmetric, tongue midline No drift. Fine motor intact and brisk, much improved. Strength 5/5 UE and LE Right groin soft, NT, no hematoma or ecchymosis   Current Facility-Administered Medications:    0.9 %  sodium chloride infusion (Manually program via Guardrails IV Fluids), , Intravenous, Once, Marvel Plan, MD   0.9 %  sodium chloride infusion, , Intravenous, Continuous, Deveshwar, Sanjeev, MD, Last Rate: 75 mL/hr at 02/16/22 0823, New Bag at 02/16/22 4782   acetaminophen (TYLENOL) tablet 650 mg, 650 mg, Oral, Q4H PRN, 650 mg at 02/15/22 2233 **OR** acetaminophen (TYLENOL) 160 MG/5ML solution 650 mg, 650 mg, Per Tube, Q4H PRN **OR** acetaminophen (TYLENOL) suppository 650 mg, 650 mg, Rectal, Q4H PRN, Deveshwar, Sanjeev, MD   albumin human 5 % solution 12.5 g, 12.5 g, Intravenous, Q6H, Marvel Plan, MD, Last Rate: 60 mL/hr at 02/16/22 0559, 12.5 g at 02/16/22 0559   aspirin chewable tablet 81 mg, 81 mg, Oral, Daily **OR** aspirin chewable tablet 81 mg, 81 mg, Per Tube, Daily, Deveshwar, Sanjeev, MD   butalbital-acetaminophen-caffeine (FIORICET) 50-325-40 MG per tablet 1 tablet, 1 tablet, Oral, Q8H PRN, Marvel Plan, MD, 1 tablet at 02/14/22 1334   Chlorhexidine Gluconate  Cloth 2 % PADS 6 each, 6 each, Topical, Q0600, Bhagat, Srishti L, MD, 6 each at 02/14/22 0934   clevidipine (CLEVIPREX) infusion 0.5 mg/mL, 0-21 mg/hr, Intravenous, Continuous, Deveshwar, Sanjeev, MD   ferrous sulfate tablet 325 mg, 325 mg, Oral, TID WC, Marvel Plan, MD, 325 mg at 02/16/22 0827   labetalol (NORMODYNE) injection 5 mg, 5 mg, Intravenous, Q10 min PRN, Bhagat, Srishti L, MD   ondansetron (ZOFRAN) injection 4 mg, 4 mg, Intravenous, Q6H PRN, Bhagat, Srishti L, MD, 4 mg at 02/13/22 2338   pantoprazole (PROTONIX) EC tablet 40 mg, 40 mg, Oral, Daily, Marvel Plan, MD, 40 mg at 02/15/22 1304   rosuvastatin (CRESTOR) tablet 20 mg, 20 mg, Oral, Daily, de Saintclair Halsted, [place] E, NP, 20 mg at 02/15/22 1304   senna-docusate (Senokot-S) tablet 1 tablet, 1 tablet, Oral, QHS PRN, Bhagat, Srishti L, MD   ticagrelor (BRILINTA) tablet 90 mg, 90 mg, Oral, BID, 90 mg at 02/15/22 2233 **OR** ticagrelor (BRILINTA) tablet 90 mg, 90 mg, Per Tube, BID, Julieanne Cotton, MD  Labs: CBC Recent Labs    02/15/22 1336 02/16/22 0506  WBC 11.6* 9.0  HGB 8.0* 6.3*  HCT 27.7* 21.6*  PLT 337 245   BMET Recent Labs    02/15/22 1336 02/16/22 0506  NA 136 135  K 4.4 3.7  CL 107 109  CO2 22 21*  GLUCOSE 149* 114*  BUN 7 5*  CREATININE 0.77 0.75  CALCIUM 7.7* 7.5*   LFT Recent Labs    02/13/22  2131  PROT 7.1  ALBUMIN 3.4*  AST 21  ALT 18  ALKPHOS 91  BILITOT 0.5   PT/INR Recent Labs    02/13/22 2131  LABPROT 12.6  INR 1.0     Studies/Results: CT HEAD WO CONTRAST (5MM)  Result Date: 02/15/2022 CLINICAL DATA:  Stroke, follow up EXAM: CT HEAD WITHOUT CONTRAST TECHNIQUE: Contiguous axial images were obtained from the base of the skull through the vertex without intravenous contrast. RADIATION DOSE REDUCTION: This exam was performed according to the departmental dose-optimization program which includes automated exposure control, adjustment of the mA and/or kV according to patient size and/or  use of iterative reconstruction technique. COMPARISON:  Feb 13, 2022. FINDINGS: Brain: No evidence of acute large vascular territory infarction, hemorrhage, hydrocephalus, extra-axial collection or mass lesion/mass effect. Vascular: No definite hyperdense vessel identified. Skull: No acute fracture. Sinuses/Orbits: Moderate paranasal sinus mucosal thickening. Other: No mastoid effusions. IMPRESSION: No evidence of acute intracranial abnormality. Electronically Signed   By: Feliberto HartsFrederick S Jones M.D.   On: 02/15/2022 11:57   CT ANGIO NECK W OR WO CONTRAST  Result Date: 02/15/2022 CLINICAL DATA:  [age] year old female status post code stroke presentation on 02/13/2022, evidence of left ICA thrombus on CTA. Punctate left hemisphere infarcts on MRI at that time. Recurrent right extremity symptoms, word finding difficulty. EXAM: CT ANGIOGRAPHY NECK TECHNIQUE: Multidetector CT imaging of the neck was performed using the standard protocol during bolus administration of intravenous contrast. Multiplanar CT image reconstructions and MIPs were obtained to evaluate the vascular anatomy. Carotid stenosis measurements (when applicable) are obtained utilizing NASCET criteria, using the distal internal carotid diameter as the denominator. RADIATION DOSE REDUCTION: This exam was performed according to the departmental dose-optimization program which includes automated exposure control, adjustment of the mA and/or kV according to patient size and/or use of iterative reconstruction technique. CONTRAST:  75mL OMNIPAQUE IOHEXOL 350 MG/ML SOLN COMPARISON:  Brain MRI today.  Recent CTA head and neck 02/13/2022. FINDINGS: Skeleton: No acute osseous abnormality identified. Cervical spine disc and endplate degeneration. Upper chest: Stable, negative. Visible central pulmonary arteries appear patent. Other neck: Stable, negative. Negative visible posterior fossa. Aortic arch: 3 vessel arch configuration. Minimal arch atherosclerosis only in the  distal arch. Small prominent possible bronchial artery origin on series 5, image 97 is stable. Right carotid system: Stable and negative. Visible right ICA siphon appears patent and negative. Left carotid system: Negative left CCA origin. Left CCA is within normal limits before the bifurcation. But at the carotid bifurcation elongated low-density filling defect tracking into the left ICA bulb has increased most resembling adherent thrombus (series 5, image 45 versus series 5 image 101 previously, now measuring between 5 and 10 mm and possibly associated with conspicuous low-density plaque or clot at the posterior left ICA origin (series 5, image 47). There is now some hemodynamically significant stenosis around the clot on series 5, image 45, but distal to that the left ICA is patent without irregularity through the petrous left ICA siphon. Vertebral arteries: Stable and negative. Codominant vertebral arteries without stenosis to the vertebrobasilar junction. Patent PICA origins. Anatomic variants: None in the neck. Review of the MIP images confirms the above findings IMPRESSION: 1. Enlarged thrombus within the Left ICA origin since the CTA on 02/13/2022. See series 5, image 45. This might be clot adherent to low-density plaque at the posterior left carotid bifurcation. 2. But elsewhere CTA of the Neck remains negative. Study discussed by telephone with Dr. Iver NestleBhagat on 02/15/2022 at 05:55 . Electronically Signed  By: Odessa Fleming M.D.   On: 02/15/2022 05:56   MR BRAIN WO CONTRAST  Result Date: 02/15/2022 CLINICAL DATA:  51 year old female status post code stroke presentation on 02/13/2022, evidence of left ICA thrombus on CTA. Punctate left hemisphere infarcts on MRI at that time. Recurrent right extremity symptoms, word finding difficulty. EXAM: MRI HEAD WITHOUT CONTRAST TECHNIQUE: Multiplanar, multiecho pulse sequences of the brain and surrounding structures were obtained without intravenous contrast. COMPARISON:   Brain MRI 02/13/2022 and earlier. FINDINGS: Brain: Scattered punctate and occasionally patchy foci of restricted diffusion in the left hemisphere are increased in conspicuity since 02/13/2022. Involvement in the left frontal lobe including the anterior and posterior middle frontal gyrus (series 7, image 71), left perirolandic area superiorly (series 5, image 98), left parietal lobe, superior left occipital lobe along the left parietooccipital sulcus (series 5, image 85), and also the left occipital pole (series 5, image 76). Left posterior communicating artery present on the recent CTA. Deep gray nuclei are spared. No contralateral right hemisphere or posterior fossa restricted diffusion identified. Mild T2 and FLAIR hyperintensity in the affected areas (left perirolandic cortex and subcortical white matter series 11, image 21). No acute intracranial hemorrhage identified. Scattered additional nonspecific cerebral white matter T2 and FLAIR hyperintensity appears stable. No chronic cerebral blood products identified on SWI. No midline shift, mass effect, evidence of mass lesion, ventriculomegaly, extra-axial collection. Cervicomedullary junction and pituitary are within normal limits. Vascular: Major intracranial vascular flow voids are preserved, stable. Skull and upper cervical spine: Negative. Sinuses/Orbits: Stable paranasal sinus mucosal thickening. Orbits appear stable and negative. Other: Mastoids remain clear.  Negative visible scalp and face. IMPRESSION: 1. Increased conspicuity and mild progression of scattered but mostly punctate infarcts in the Left cerebral hemisphere. Involvement in both the Left MCA and PCA territories now (newly including the left occipital pole). 2. No associated hemorrhage or mass effect. No other vascular territories affected. Electronically Signed   By: Odessa Fleming M.D.   On: 02/15/2022 05:07   ECHOCARDIOGRAM COMPLETE  Result Date: 02/14/2022    ECHOCARDIOGRAM REPORT   Patient  Name:   JAYDON SOROKA Date of Exam: 02/14/2022 Medical Rec #:  [id number]    Height: Accession #:    [id number]   Weight:       175.4 lb Date of Birth:  27-Apr-1971    BSA:          1.850 m Patient Age:    51 years     BP:           111/64 mmHg Patient Gender: F            HR:           62 bpm. Exam Location:  Inpatient Procedure: 2D Echo, Cardiac Doppler and Color Doppler Indications:    Stroke I63.9  History:        Patient has no prior history of Echocardiogram examinations.  Sonographer:    Leta Jungling RDCS Referring Phys: [id number] SRISHTI L BHAGAT IMPRESSIONS  1. Left ventricular ejection fraction, by estimation, is 60 to 65%. The left ventricle has normal function. The left ventricle has no regional wall motion abnormalities. Left ventricular diastolic parameters were normal.  2. Right ventricular systolic function is normal. The right ventricular size is normal.  3. The mitral valve is normal in structure. Trivial mitral valve regurgitation. No evidence of mitral stenosis.  4. The aortic valve is tricuspid. Aortic valve regurgitation is not visualized. No aortic stenosis is present.  5.  The inferior vena cava is normal in size with greater than 50% respiratory variability, suggesting right atrial pressure of 3 mmHg. Comparison(s): No prior Echocardiogram. FINDINGS  Left Ventricle: Left ventricular ejection fraction, by estimation, is 60 to 65%. The left ventricle has normal function. The left ventricle has no regional wall motion abnormalities. The left ventricular internal cavity size was normal in size. There is  no left ventricular hypertrophy. Left ventricular diastolic parameters were normal. Right Ventricle: The right ventricular size is normal. Right ventricular systolic function is normal. Left Atrium: Left atrial size was normal in size. Right Atrium: Right atrial size was normal in size. Pericardium: There is no evidence of pericardial effusion. Mitral Valve: The mitral valve is normal in structure.  Trivial mitral valve regurgitation. No evidence of mitral valve stenosis. Tricuspid Valve: The tricuspid valve is normal in structure. Tricuspid valve regurgitation is trivial. No evidence of tricuspid stenosis. Aortic Valve: The aortic valve is tricuspid. Aortic valve regurgitation is not visualized. No aortic stenosis is present. Pulmonic Valve: The pulmonic valve was not well visualized. Pulmonic valve regurgitation is trivial. No evidence of pulmonic stenosis. Aorta: The aortic root is normal in size and structure. Venous: The inferior vena cava is normal in size with greater than 50% respiratory variability, suggesting right atrial pressure of 3 mmHg. IAS/Shunts: The interatrial septum was not well visualized.  LEFT VENTRICLE PLAX 2D LVIDd:         4.60 cm   Diastology LVIDs:         2.10 cm   LV e' medial:    8.76 cm/s LV PW:         0.70 cm   LV E/e' medial:  10.9 LV IVS:        0.70 cm   LV e' lateral:   12.70 cm/s LVOT diam:     1.70 cm   LV E/e' lateral: 7.5 LV SV:         43 LV SV Index:   23 LVOT Area:     2.27 cm  RIGHT VENTRICLE RV S prime:     11.60 cm/s TAPSE (M-mode): 1.7 cm LEFT ATRIUM             Index        RIGHT ATRIUM          Index LA diam:        3.00 cm 1.62 cm/m   RA Area:     8.81 cm LA Vol (A2C):   27.1 ml 14.65 ml/m  RA Volume:   15.50 ml 8.38 ml/m LA Vol (A4C):   32.9 ml 17.78 ml/m LA Biplane Vol: 31.6 ml 17.08 ml/m  AORTIC VALVE LVOT Vmax:   94.20 cm/s LVOT Vmean:  60.600 cm/s LVOT VTI:    0.188 m  AORTA Ao Root diam: 2.70 cm MITRAL VALVE MV Area (PHT): 3.63 cm    SHUNTS MV Decel Time: 209 msec    Systemic VTI:  0.19 m MV E velocity: 95.40 cm/s  Systemic Diam: 1.70 cm MV A velocity: 61.80 cm/s MV E/A ratio:  1.54 Olga Millers MD Electronically signed by Olga Millers MD Signature Date/Time: 02/14/2022/12:24:48 PM    Final    VAS [place] LOWER EXTREMITY VENOUS (DVT)  Result Date: 02/14/2022  Lower Venous DVT Study Patient Name:  SHEARON CLONCH  Date of Exam:   02/14/2022 Medical  Rec #: [id number]     Accession #:    [id number] Date of Birth: 05-28-1971  Patient Gender: F Patient Age:   65 years Exam Location:  [facility] Procedure:      VAS [place] LOWER EXTREMITY VENOUS (DVT) Referring Phys: Scheryl Marten XU --------------------------------------------------------------------------------  Indications: Embolic stroke.  Comparison Study: No prior studies. Performing Technologist: Jean Rosenthal RDMS, RVT  Examination Guidelines: A complete evaluation includes B-mode imaging, spectral Doppler, color Doppler, and power Doppler as needed of all accessible portions of each vessel. Bilateral testing is considered an integral part of a complete examination. Limited examinations for reoccurring indications may be performed as noted. The reflux portion of the exam is performed with the patient in reverse Trendelenburg.  +---------+---------------+---------+-----------+----------+--------------+ RIGHT    CompressibilityPhasicitySpontaneityPropertiesThrombus Aging +---------+---------------+---------+-----------+----------+--------------+ CFV      Full           Yes                                          +---------+---------------+---------+-----------+----------+--------------+ SFJ      Full                                                        +---------+---------------+---------+-----------+----------+--------------+ FV Prox  Full                                                        +---------+---------------+---------+-----------+----------+--------------+ FV Mid   Full                                                        +---------+---------------+---------+-----------+----------+--------------+ FV DistalFull                                                        +---------+---------------+---------+-----------+----------+--------------+ PFV      Full                                                         +---------+---------------+---------+-----------+----------+--------------+ POP      Full           Yes      Yes                                 +---------+---------------+---------+-----------+----------+--------------+ PTV      Full                                                        +---------+---------------+---------+-----------+----------+--------------+  PERO     Full                                                        +---------+---------------+---------+-----------+----------+--------------+ Gastroc  Full                                                        +---------+---------------+---------+-----------+----------+--------------+   +---------+---------------+---------+-----------+----------+--------------+ LEFT     CompressibilityPhasicitySpontaneityPropertiesThrombus Aging +---------+---------------+---------+-----------+----------+--------------+ CFV      Full           Yes      Yes                                 +---------+---------------+---------+-----------+----------+--------------+ SFJ      Full                                                        +---------+---------------+---------+-----------+----------+--------------+ FV Prox  Full                                                        +---------+---------------+---------+-----------+----------+--------------+ FV Mid   Full                                                        +---------+---------------+---------+-----------+----------+--------------+ FV DistalFull                                                        +---------+---------------+---------+-----------+----------+--------------+ PFV      Full                                                        +---------+---------------+---------+-----------+----------+--------------+ POP      Full           Yes      Yes                                  +---------+---------------+---------+-----------+----------+--------------+ PTV      Full                                                        +---------+---------------+---------+-----------+----------+--------------+  PERO     Full                                                        +---------+---------------+---------+-----------+----------+--------------+ Gastroc  Full                                                        +---------+---------------+---------+-----------+----------+--------------+     Summary: RIGHT: - There is no evidence of deep vein thrombosis in the lower extremity.  - No cystic structure found in the popliteal fossa.  LEFT: - There is no evidence of deep vein thrombosis in the lower extremity.  - No cystic structure found in the popliteal fossa.  *See table(s) above for measurements and observations. Electronically signed by Lemar Livings MD on 02/14/2022 at 12:57:14 PM.    Final     Assessment/Plan: Status post revascularization of proximal left internal carotid artery with stent assisted angioplasty Doing well neurologically Can be OOB and work with therapy as tolerated Hgb cont to trend down. Awaiting BM, can order Colace bid To receive PRBC. May need CT AP though no real abd/back pain. Cont ASA and Brilinta Discussed with pot and husband that will need to find follow up back home.     LOS: 3 days   I spent a total of 30 minutes in face to face in clinical consultation, greater than 50% of which was counseling/coordinating care for (L)ICA stent angioplasty  Brayton El PA-C 02/16/2022 9:05 AM

## 2022-02-16 NOTE — Progress Notes (Addendum)
STROKE TEAM PROGRESS NOTE   INTERVAL HISTORY Patient is seen in her room with one family member at the bedside.  Her HGB was 6.3 this morning, will transfuse 1 unit PRBCs.  CT abdomen and pelvis shows small amount of extraperitoneal blood with no significant postprocedural hematoma.  Vitals:   02/16/22 1030 02/16/22 1153 02/16/22 1245 02/16/22 1300  BP: 128/76  115/77 118/76  Pulse: 61   66  Resp: (!) 21   (!) 29  Temp:  98.8 F (37.1 C) 98.4 F (36.9 C)   TempSrc:  Oral Oral   SpO2: 96%   99%  Weight:       CBC:  Recent Labs  Lab 02/13/22 2131 02/15/22 1336 02/16/22 0506  WBC 10.5 11.6* 9.0  NEUTROABS 6.1  --  6.9  HGB 9.6* 8.0* 6.3*  HCT 32.3* 27.7* 21.6*  MCV 68.4* 69.1* 68.8*  PLT 422* 337 245    Basic Metabolic Panel:  Recent Labs  Lab 02/15/22 1336 02/16/22 0506  NA 136 135  K 4.4 3.7  CL 107 109  CO2 22 21*  GLUCOSE 149* 114*  BUN 7 5*  CREATININE 0.77 0.75  CALCIUM 7.7* 7.5*  MG 1.9  --   PHOS 2.2*  --     Lipid Panel:  Recent Labs  Lab 02/14/22 0304  CHOL 203*  TRIG 58  HDL 55  CHOLHDL 3.7  VLDL 12  LDLCALC 161*    HgbA1c:  Recent Labs  Lab 02/13/22 2131  HGBA1C 5.6    Urine Drug Screen:  Recent Labs  Lab 02/14/22 1523  LABOPIA NONE DETECTED  COCAINSCRNUR NONE DETECTED  LABBENZ NONE DETECTED  AMPHETMU NONE DETECTED  THCU NONE DETECTED  LABBARB NONE DETECTED     Alcohol Level  Recent Labs  Lab 02/13/22 2131  ETH <10     IMAGING past 24 hours CT ABDOMEN PELVIS WO CONTRAST  Result Date: 02/16/2022 CLINICAL DATA:  Abdominal pain.  Stroke with revascularization. EXAM: CT ABDOMEN AND PELVIS WITHOUT CONTRAST TECHNIQUE: Multidetector CT imaging of the abdomen and pelvis was performed following the standard protocol without IV contrast. RADIATION DOSE REDUCTION: This exam was performed according to the departmental dose-optimization program which includes automated exposure control, adjustment of the mA and/or kV according to  patient size and/or use of iterative reconstruction technique. COMPARISON:  None FINDINGS: Lower chest: Mild atelectasis at the lung bases.  No lobar collapse. Hepatobiliary: Normal without contrast. Vicarious excretion of contrast dependent in the gallbladder. No discrete stone. Pancreas: Normal Spleen: Normal Adrenals/Urinary Tract: Adrenal glands are normal. Kidneys are normal. Bladder is normal, containing some excreted contrast. Stomach/Bowel: Stomach and small intestine are normal. Appendix is normal. No colon pathology. Vascular/Lymphatic: Aorta and IVC are normal. No adenopathy. There is a small amount of extravascular blood along the right iliac vessels within the pelvic retroperitoneum. No sizable discrete hematoma. Reproductive: Normal Other: No free fluid or air. Musculoskeletal: Normal IMPRESSION: Small amount of extravascular extraperitoneal blood along the right iliac region without evidence of a large postprocedural hematoma. Otherwise negative. Electronically Signed   By: Paulina Fusi M.D.   On: 02/16/2022 10:24    PHYSICAL EXAM General:  Alert, well-developed, well-nourished patient in no acute distress Respiratory:  Regular, unlabored respirations on room air  NEURO:  Mental Status: AA&Ox3  Speech/Language: speech is without dysarthria or aphasia.  Repetition, fluency, and comprehension intact.  Cranial Nerves:  II: PERRL. Visual fields full.  III, IV, VI: EOMI. Eyelids elevate symmetrically.  V: Sensation  is intact to light touch and symmetrical to face.  VII: Smile is symmetrical.   VIII: hearing intact to voice. IX, X: Phonation is normal.  JY:NWGNFAOZ shrug 5/5. XII: tongue is midline without fasciculations. Motor: 5/5 strength to all muscle groups tested.  Tone: is normal and bulk is normal Sensation- Intact to light touch bilaterally.  Coordination: No ataxia on FNF Gait- deferred   ASSESSMENT/PLAN [name] is a 51 y.o. female with history of migraines  presenting with sudden onset right arm weakness and aphasia.  She was evaluated in the ED and given TNK to treat possible stroke.  Symptoms have improved, and MRI reveals punctate infarcts in left frontal and parietal lobes.  She does c/o headache, so fioricet was ordered.  Taken as a code IR for worsening symptoms on 5/30 early in the morning. Stent placed in left ICA by Dr. Corliss Skains. 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 Plan for ASA and Brilinta after CT if no hemorrhagic transformation. Currently on cangrelor, will switch to brilinta after repeat CT head.   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 ASPECTS 10.    CTA head & neck filling defect in left ICA concerning for thrombus, no LVO or hemodynamically significant stenosis CT perfusion no core or penumbra CT Head Repeat- No evidence of acute intracranial abnormality.  MRI  punctate infarcts in left frontal and parietal lobes MRI repeat Increased conspicuity and mild progression of scattered but mostly punctate infarcts in the Left MCA and watershed areas.  CTA neck repeat Enlarged thrombus within the Left ICA origin  IR s/p left ICA stenting and also showed left ICA luminal linear hyperdensity concerning for carotid web. 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 S/p L ICA stenting Likely the source of intraluminal  thrombus  Hypotension Home meds:  none Stable now On IVF S/p albumin Q6h x 4 Pt seems to have baseline low BP at home also  Hyperlipidemia Home meds:  none LDL 136, goal < 70 Add rosuvastatin 20 mg daily  Continue statin at discharge  Iron deficiency anemia Hemoglobin 9.6->8.0-> 6.3-> 2u PRBCs->9.7 Low MCV and MCH Iron level 14, ferritin 5 Put on iron pills  Other Stroke Risk Factors Migraines, 3-4 times per months, lasting 2 days, with nauseous and photophobia, phonophobia - PRN Fioricet.   Left Bell's palsy in 1990s  Other Active Problems Leukocytosis, WBC 11.6  Hospital day # 3  Patient seen and examined by NP/APP with MD. MD to update note as needed.   Erika Jordan , MSN, AGACNP-BC [facility] See Amion for schedule and pager information 02/16/2022 2:16 PM   ATTENDING NOTE: I reviewed above note and agree with the assessment and plan. Pt was seen and examined.   Husband at bedside.  Patient sitting in chair, awake alert orientated.  Still pale but neurologically intact today.  Hemoglobin down to 6.3 this morning, no obvious bleeding  externally.  Received 2 units PRBC transfusion and hemoglobin up to 9.7.  CT abdomen pelvis did not show retroperitoneal hemorrhage.  Continue aspirin and Brilinta and statin.  TCD bubble study negative for PFO.  PT/OT no recommendation.  For detailed assessment and plan, please refer to above as I have made changes wherever appropriate.   Erika PlanJindong Jackey Housey, MD PhD Stroke Neurology 02/16/2022 6:37 PM  This patient is critically ill due to left MCA stroke, left ICA thrombus, hypertension, severe anemia and at significant risk of neurological worsening, death form recurrent stroke, hemorrhagic transformation, hypovolemic shock. This patient's care requires constant monitoring of vital signs, hemodynamics, respiratory and cardiac monitoring, review of multiple databases, neurological assessment, discussion with family, other  specialists and medical decision making of high complexity. I spent 35 minutes of neurocritical care time in the care of this patient.    To contact Stroke Continuity provider, please refer to [email or web address]. After hours, contact General Neurology

## 2022-02-16 NOTE — Progress Notes (Signed)
Consult placed for lab draw. This patient does not have a central line. Secure chat sent to nurse. Tomasita Morrow, RN VAST

## 2022-02-17 ENCOUNTER — Inpatient Hospital Stay (HOSPITAL_COMMUNITY): Payer: No Typology Code available for payment source

## 2022-02-17 DIAGNOSIS — E7800 Pure hypercholesterolemia, unspecified: Secondary | ICD-10-CM

## 2022-02-17 DIAGNOSIS — I639 Cerebral infarction, unspecified: Secondary | ICD-10-CM

## 2022-02-17 LAB — TYPE AND SCREEN
ABO/RH(D): B POS
Antibody Screen: NEGATIVE
Unit division: 0
Unit division: 0

## 2022-02-17 LAB — BASIC METABOLIC PANEL
Anion gap: 7 (ref 5–15)
BUN: 5 mg/dL — ABNORMAL LOW (ref 6–20)
CO2: 19 mmol/L — ABNORMAL LOW (ref 22–32)
Calcium: 8.2 mg/dL — ABNORMAL LOW (ref 8.9–10.3)
Chloride: 111 mmol/L (ref 98–111)
Creatinine, Ser: 0.73 mg/dL (ref 0.44–1.00)
GFR, Estimated: >60 mL/min (ref >60)
Glucose, Bld: 104 mg/dL — ABNORMAL HIGH (ref 70–99)
Potassium: 3.6 mmol/L (ref 3.5–5.1)
Sodium: 137 mmol/L (ref 135–145)

## 2022-02-17 LAB — BPAM RBC
Blood Product Expiration Date: 202306132359
Blood Product Expiration Date: 202306142359
ISSUE DATE / TIME: 202305310933
ISSUE DATE / TIME: 202305310933
Unit Type and Rh: 7300
Unit Type and Rh: 7300

## 2022-02-17 LAB — CBC
HCT: 31.5 % — ABNORMAL LOW (ref 36.0–46.0)
Hemoglobin: 9.9 g/dL — ABNORMAL LOW (ref 12.0–15.0)
MCH: 22.8 pg — ABNORMAL LOW (ref 26.0–34.0)
MCHC: 31.4 g/dL (ref 30.0–36.0)
MCV: 72.4 fL — ABNORMAL LOW (ref 80.0–100.0)
Platelets: 266 10*3/uL (ref 150–400)
RBC: 4.35 MIL/uL (ref 3.87–5.11)
RDW: 19.9 % — ABNORMAL HIGH (ref 11.5–15.5)
WBC: 12.1 10*3/uL — ABNORMAL HIGH (ref 4.0–10.5)
nRBC: 0.2 % (ref 0.0–0.2)

## 2022-02-17 LAB — MAGNESIUM: Magnesium: 1.8 mg/dL (ref 1.7–2.4)

## 2022-02-17 LAB — TROPONIN I (HIGH SENSITIVITY): Troponin I (High Sensitivity): 55 ng/L — ABNORMAL HIGH (ref <18)

## 2022-02-17 IMAGING — CT CT ANGIO HEAD-NECK (W OR W/O PERF)
1 of 11 series · 5 of 33 positions shown · non-contrast
Comparison: Prior study from [DATE].

CLINICAL DATA: Initial evaluation for stroke/TIA.

EXAM:
CT ANGIOGRAPHY HEAD AND NECK
TECHNIQUE: Multidetector CT imaging of the head and neck was performed using
the standard protocol during bolus administration of intravenous
contrast. Multiplanar CT image reconstructions and MIPs were
obtained to evaluate the vascular anatomy. Carotid stenosis
measurements (when applicable) are obtained utilizing NASCET
criteria, using the distal internal carotid diameter as the
denominator.

[Series 9: cta neck axial · axial · 0.39mm/px · z∈[-162,+48]mm · 5 of 316 slices shown]
[im 53/316  soft-tissue]
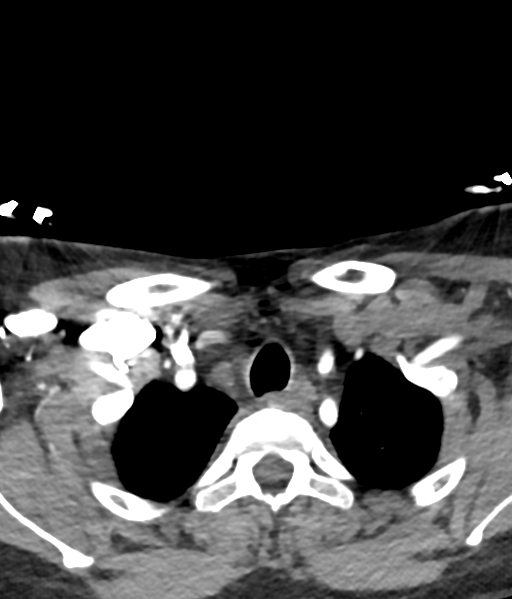
[im 106/316  bone]
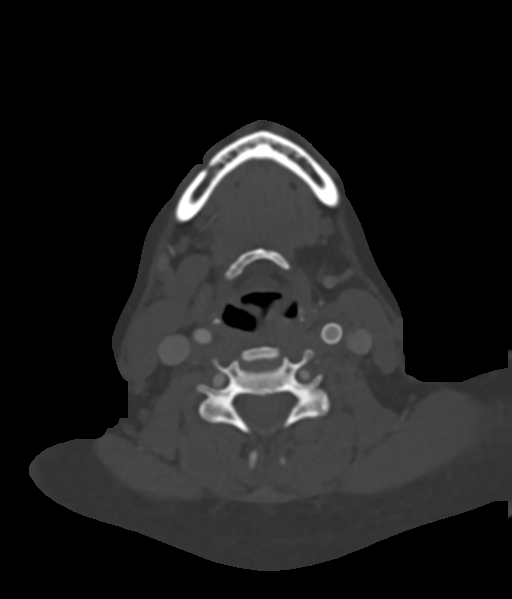
[im 158/316  soft-tissue]
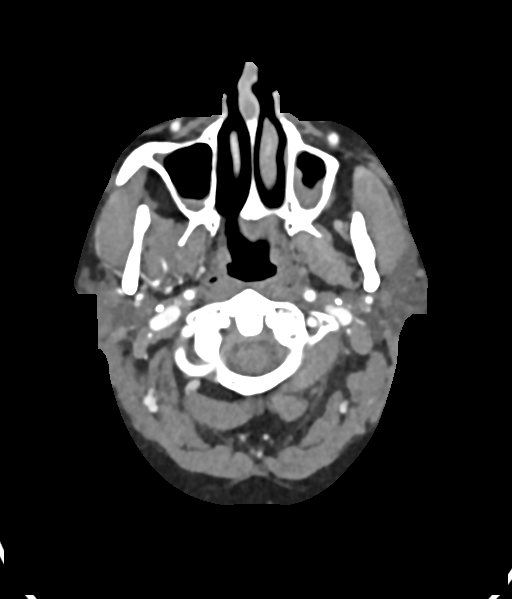
[im 211/316  bone]
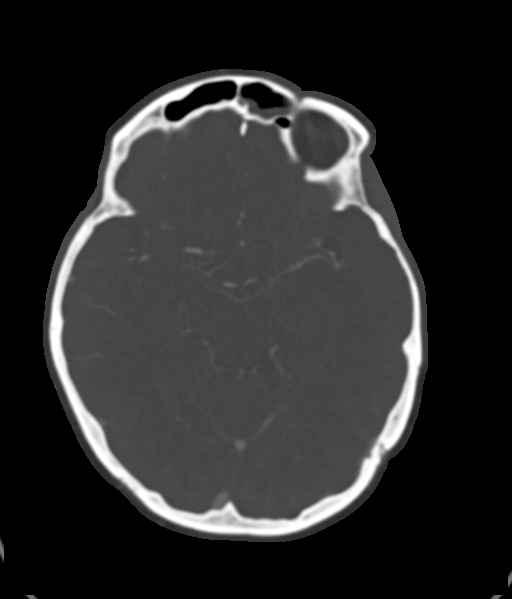
[im 263/316  soft-tissue]
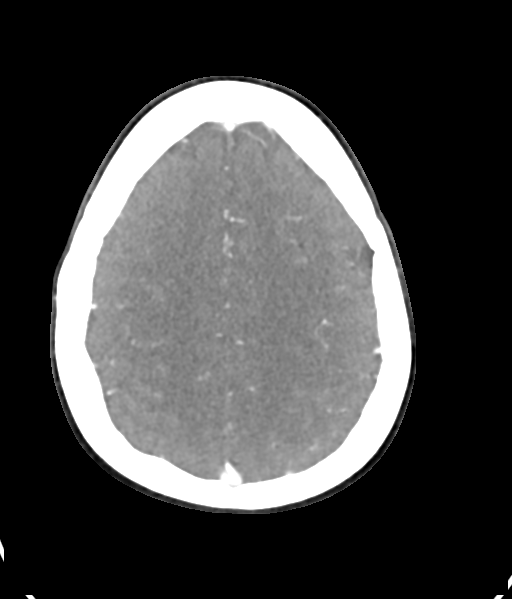

[5 of 33 positions shown; findings below may reference images not displayed]

RADIATION DOSE REDUCTION: This exam was performed according to the
departmental dose-optimization program which includes automated
exposure control, adjustment of the mA and/or kV according to
patient size and/or use of iterative reconstruction technique.

CONTRAST:  65mL OMNIPAQUE IOHEXOL 350 MG/ML SOLN
FINDINGS: CT HEAD FINDINGS

Brain: Cerebral volume within normal limits for patient age.

No evidence for acute intracranial hemorrhage. No findings to
suggest acute large vessel territory infarct. No mass lesion,
midline shift, or mass effect. Ventricles are normal in size without
evidence for hydrocephalus. No extra-axial fluid collection
identified.

Vascular: No hyperdense vessel identified.

Skull: Scalp soft tissues demonstrate no acute abnormality.
Calvarium intact.

Sinuses/Orbits: Globes and orbital soft tissues within normal
limits.

Mild-to-moderate mucosal thickening noted about the ethmoidal air
cells and maxillary sinuses. No mastoid effusion.

CTA NECK FINDINGS

Aortic arch: Visualized aortic arch normal caliber with normal
branch pattern. No stenosis about the origin the great vessels.

Right carotid system: Right common and internal carotid arteries
widely patent without stenosis, dissection or occlusion.

Left carotid system: Interval placement of a vascular stent across
the left carotid bifurcation. Widely patent flow seen through the
stent. Left carotid artery system remains widely patent elsewhere
without stenosis or dissection.

Vertebral arteries: Both vertebral arteries arise from the
subclavian arteries. No proximal subclavian artery stenosis. Both
vertebral arteries widely patent without stenosis, dissection or
occlusion.

Skeleton: Degenerative spondylosis at C5-6 and C6-7.

Other neck: No other acute finding within the neck.

Upper chest: Layering bilateral pleural effusions, right slightly
larger than left. Mild pulmonary interstitial edema/congestion
within the visualized lungs.

Review of the MIP images confirms the above findings

CTA HEAD FINDINGS

Anterior circulation: Both internal carotid arteries widely patent
to the termini without stenosis. A1 segments widely patent. Normal
anterior communicating artery complex. Both anterior cerebral
arteries widely patent to their distal aspects without stenosis. No
M1 stenosis or occlusion. Normal MCA bifurcations. Distal MCA
branches well perfused and symmetric.

Posterior circulation: Both V4 segments patent to the
vertebrobasilar junction without stenosis. Both PICA origins patent
and normal. Basilar widely patent to its distal aspect without
stenosis. Superior cerebellar arteries patent bilaterally. Both PCAs
widely patent and well perfused to their distal aspects.

Venous sinuses: Patent.

Anatomic variants: None significant.  No aneurysm.

Review of the MIP images confirms the above findings
IMPRESSION: 1. Interval placement of a vascular stent across the left carotid
bifurcation/proximal left ICA. Widely patent flow seen through the
stent without complication.
2. Otherwise negative CTA of the head and neck. No other acute
intracranial abnormality.
3. Layering bilateral pleural effusions with mild pulmonary
interstitial congestion/edema.

## 2022-02-17 IMAGING — DX DG CHEST 1V PORT
1 series · 1 of 1 positions shown · non-contrast
Comparison: None Available.

CLINICAL DATA: Shortness of breath

EXAM:
PORTABLE CHEST 1 VIEW

[chest ap]
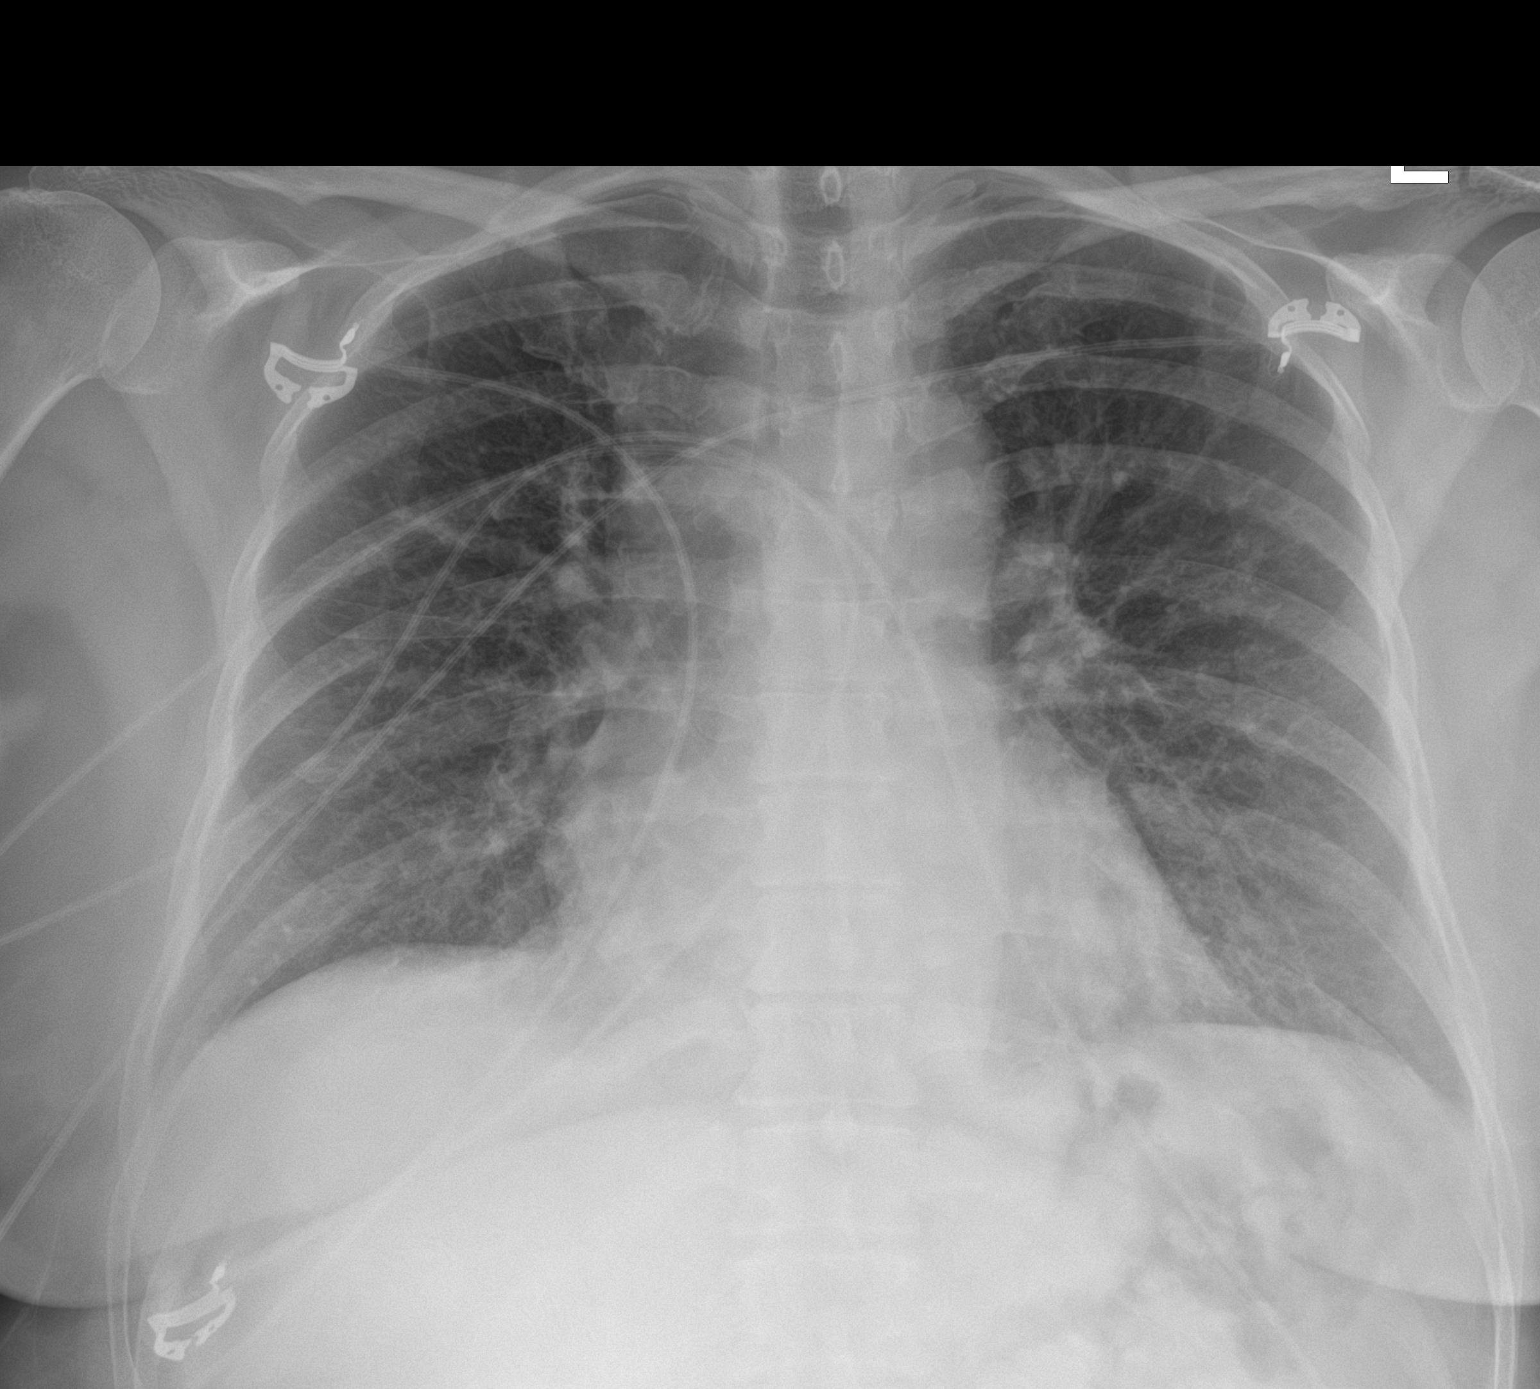

[1 of 1 positions shown; findings below may reference images not displayed]

FINDINGS: Peribronchial thickening and interstitial prominence. No confluent
airspace opacities or effusions. Heart is normal size. No acute bony
abnormality.
IMPRESSION: Peribronchial thickening and interstitial prominence, likely
bronchitic changes.

## 2022-02-17 MED ORDER — FERROUS SULFATE 325 (65 FE) MG PO TABS: 325.0000 mg | ORAL_TABLET | Freq: Three times a day (TID) | ORAL | 3 refills | Status: AC

## 2022-02-17 MED ORDER — ASPIRIN 81 MG PO CHEW: 81.0000 mg | CHEWABLE_TABLET | Freq: Every day | ORAL | 1 refills | Status: AC

## 2022-02-17 MED ORDER — TICAGRELOR 90 MG PO TABS: 90.0000 mg | ORAL_TABLET | Freq: Two times a day (BID) | ORAL | 1 refills | Status: AC

## 2022-02-17 MED ORDER — ROSUVASTATIN CALCIUM 20 MG PO TABS: 20.0000 mg | ORAL_TABLET | Freq: Every day | ORAL | 1 refills | Status: AC

## 2022-02-17 MED ORDER — IOHEXOL 350 MG/ML SOLN
65.0000 mL | Freq: Once | INTRAVENOUS | Status: AC | PRN
  Administered 2022-02-17: 65 mL via INTRAVENOUS

## 2022-02-17 NOTE — Progress Notes (Addendum)
Patient was concerned regarding taking ticagrelor dose after episode of anxiety, SOB, and throat tightness overnight. 10AM dose of ticagrelor held as patient wanted to discuss with providers.This RN reached out to Dr. Erlinda Hong, and he deferred to Dr. Estanislado Pandy. Spoke with patient and she stated that Dr. Arlean Hopping recommendation was to take the ticagrelor dose and take coffee or caffeine with dose to decrease severity of side effects. This afternoon patient agreeable to take dose of ticagrelor, see MAR for details.  Dr. Erlinda Hong notified that patient would like to know if discharge home today would be possible. Will await response from [facility] for discharge plan.

## 2022-02-17 NOTE — Discharge Instructions (Addendum)
You are admitted for acute stroke with left carotid artery thrombus.  We gave you "clot buster" medication but your symptoms recurred.  Repeat test showed left carotid artery thrombus progressed, therefore we put carotid stent into the left carotid artery.  Etiology of your stroke concerning for left carotid web, however, not 100% certain. Therefore we also did other tests including heart ultrasound, leg ultrasound, brain vessel ultrasound, hypercoagulable labs, all were negative.  We put you on aspirin and Brilinta for 3 to 6 months.  You had mild shortness of breath side effect from Brilinta but we feel okay to continue.  Hopefully the side effect will subside over short period time.  Also put on Crestor for elevated LDL.  Please see your PCP within 2 weeks of discharging. Request referral to neurology in [place] from PCP for appointment with in 4 weeks of discharge.  Also let your PCP consider 30-day cardiac event monitoring to rule out irregular heart rhythm.  Your PCP can also request medical record from our hospital.  If you have recurrent stroke-like symptoms, please call [phone] for evaluation.

## 2022-02-17 NOTE — Progress Notes (Signed)
Chief Complaint: Patient was seen today for follow up (L)ICA stent  Referring Physician(s): Dr. Roda Shutters  Supervising Physician: Julieanne Cotton  Patient Status: [name] - In-pt  Subjective: Patient walking in room on visit this AM.  Has just showered.  Complains of shortess of breath approx 1 hr after taking Brilinta last PM.  Asking about discharge.   Objective: Physical Exam: BP (!) 151/88 (BP Location: Left Arm)   Pulse 65   Temp 98.5 F (36.9 C) (Oral)   Resp 16   Wt 175 lb 6.4 oz (79.6 kg)   SpO2 98%  A&O x 3 PERRLA, EOMI Face symmetric, tongue midline No drift. Strength 5/5 UE and LE   Current Facility-Administered Medications:    0.9 %  sodium chloride infusion, , Intravenous, Continuous, Deveshwar, Sanjeev, MD, Stopped at 02/17/22 0112   acetaminophen (TYLENOL) tablet 650 mg, 650 mg, Oral, Q4H PRN, 650 mg at 02/17/22 0122 **OR** acetaminophen (TYLENOL) 160 MG/5ML solution 650 mg, 650 mg, Per Tube, Q4H PRN **OR** acetaminophen (TYLENOL) suppository 650 mg, 650 mg, Rectal, Q4H PRN, Julieanne Cotton, MD   aspirin chewable tablet 81 mg, 81 mg, Oral, Daily, 81 mg at 02/17/22 1054 **OR** aspirin chewable tablet 81 mg, 81 mg, Per Tube, Daily, Deveshwar, Simonne Maffucci, MD   butalbital-acetaminophen-caffeine (FIORICET) 50-325-40 MG per tablet 1 tablet, 1 tablet, Oral, Q8H PRN, Marvel Plan, MD, 1 tablet at 02/14/22 1334   docusate sodium (COLACE) capsule 100 mg, 100 mg, Oral, BID, Bruning, Kevin, PA-C, 100 mg at 02/17/22 1054   ferrous sulfate tablet 325 mg, 325 mg, Oral, TID WC, Marvel Plan, MD, 325 mg at 02/17/22 1252   labetalol (NORMODYNE) injection 5 mg, 5 mg, Intravenous, Q10 min PRN, Bhagat, Srishti L, MD   ondansetron (ZOFRAN) injection 4 mg, 4 mg, Intravenous, Q6H PRN, Bhagat, Srishti L, MD, 4 mg at 02/13/22 2338   pantoprazole (PROTONIX) EC tablet 40 mg, 40 mg, Oral, Daily, Marvel Plan, MD, 40 mg at 02/17/22 1054   rosuvastatin (CRESTOR) tablet 20 mg, 20 mg, Oral, Daily,  de Saintclair Halsted, Cortney E, NP, 20 mg at 02/17/22 1054   senna-docusate (Senokot-S) tablet 1 tablet, 1 tablet, Oral, QHS PRN, Bhagat, Srishti L, MD   ticagrelor (BRILINTA) tablet 90 mg, 90 mg, Oral, BID, 90 mg at 02/17/22 1251 **OR** ticagrelor (BRILINTA) tablet 90 mg, 90 mg, Per Tube, BID, Julieanne Cotton, MD  Labs: CBC Recent Labs    02/16/22 0506 02/16/22 1458 02/17/22 0243  WBC 9.0  --  12.1*  HGB 6.3* 9.7* 9.9*  HCT 21.6* 31.4* 31.5*  PLT 245  --  266   BMET Recent Labs    02/16/22 0506 02/17/22 0243  NA 135 137  K 3.7 3.6  CL 109 111  CO2 21* 19*  GLUCOSE 114* 104*  BUN 5* 5*  CREATININE 0.75 0.73  CALCIUM 7.5* 8.2*   LFT No results for input(s): PROT, ALBUMIN, AST, ALT, ALKPHOS, BILITOT, BILIDIR, IBILI, LIPASE in the last 72 hours.  PT/INR No results for input(s): LABPROT, INR in the last 72 hours.    Studies/Results: CT ABDOMEN PELVIS WO CONTRAST  Result Date: 02/16/2022 CLINICAL DATA:  Abdominal pain.  Stroke with revascularization. EXAM: CT ABDOMEN AND PELVIS WITHOUT CONTRAST TECHNIQUE: Multidetector CT imaging of the abdomen and pelvis was performed following the standard protocol without IV contrast. RADIATION DOSE REDUCTION: This exam was performed according to the departmental dose-optimization program which includes automated exposure control, adjustment of the mA and/or kV according to patient size and/or  use of iterative reconstruction technique. COMPARISON:  None FINDINGS: Lower chest: Mild atelectasis at the lung bases.  No lobar collapse. Hepatobiliary: Normal without contrast. Vicarious excretion of contrast dependent in the gallbladder. No discrete stone. Pancreas: Normal Spleen: Normal Adrenals/Urinary Tract: Adrenal glands are normal. Kidneys are normal. Bladder is normal, containing some excreted contrast. Stomach/Bowel: Stomach and small intestine are normal. Appendix is normal. No colon pathology. Vascular/Lymphatic: Aorta and IVC are normal. No  adenopathy. There is a small amount of extravascular blood along the right iliac vessels within the pelvic retroperitoneum. No sizable discrete hematoma. Reproductive: Normal Other: No free fluid or air. Musculoskeletal: Normal IMPRESSION: Small amount of extravascular extraperitoneal blood along the right iliac region without evidence of a large postprocedural hematoma. Otherwise negative. Electronically Signed   By: Paulina Fusi M.D.   On: 02/16/2022 10:24   CT ANGIO HEAD NECK W WO CM  Result Date: 02/17/2022 CLINICAL DATA:  Initial evaluation for stroke/TIA. EXAM: CT ANGIOGRAPHY HEAD AND NECK TECHNIQUE: Multidetector CT imaging of the head and neck was performed using the standard protocol during bolus administration of intravenous contrast. Multiplanar CT image reconstructions and MIPs were obtained to evaluate the vascular anatomy. Carotid stenosis measurements (when applicable) are obtained utilizing NASCET criteria, using the distal internal carotid diameter as the denominator. RADIATION DOSE REDUCTION: This exam was performed according to the departmental dose-optimization program which includes automated exposure control, adjustment of the mA and/or kV according to patient size and/or use of iterative reconstruction technique. CONTRAST:  39mL OMNIPAQUE IOHEXOL 350 MG/ML SOLN COMPARISON:  Prior study from 02/15/2022. FINDINGS: CT HEAD FINDINGS Brain: Cerebral volume within normal limits for patient age. No evidence for acute intracranial hemorrhage. No findings to suggest acute large vessel territory infarct. No mass lesion, midline shift, or mass effect. Ventricles are normal in size without evidence for hydrocephalus. No extra-axial fluid collection identified. Vascular: No hyperdense vessel identified. Skull: Scalp soft tissues demonstrate no acute abnormality. Calvarium intact. Sinuses/Orbits: Globes and orbital soft tissues within normal limits. Mild-to-moderate mucosal thickening noted about the  ethmoidal air cells and maxillary sinuses. No mastoid effusion. CTA NECK FINDINGS Aortic arch: Visualized aortic arch normal caliber with normal branch pattern. No stenosis about the origin the great vessels. Right carotid system: Right common and internal carotid arteries widely patent without stenosis, dissection or occlusion. Left carotid system: Interval placement of a vascular stent across the left carotid bifurcation. Widely patent flow seen through the stent. Left carotid artery system remains widely patent elsewhere without stenosis or dissection. Vertebral arteries: Both vertebral arteries arise from the subclavian arteries. No proximal subclavian artery stenosis. Both vertebral arteries widely patent without stenosis, dissection or occlusion. Skeleton: Degenerative spondylosis at C5-6 and C6-7. Other neck: No other acute finding within the neck. Upper chest: Layering bilateral pleural effusions, right slightly larger than left. Mild pulmonary interstitial edema/congestion within the visualized lungs. Review of the MIP images confirms the above findings CTA HEAD FINDINGS Anterior circulation: Both internal carotid arteries widely patent to the termini without stenosis. A1 segments widely patent. Normal anterior communicating artery complex. Both anterior cerebral arteries widely patent to their distal aspects without stenosis. No M1 stenosis or occlusion. Normal MCA bifurcations. Distal MCA branches well perfused and symmetric. Posterior circulation: Both V4 segments patent to the vertebrobasilar junction without stenosis. Both PICA origins patent and normal. Basilar widely patent to its distal aspect without stenosis. Superior cerebellar arteries patent bilaterally. Both PCAs widely patent and well perfused to their distal aspects. Venous sinuses: Patent. Anatomic  variants: None significant.  No aneurysm. Review of the MIP images confirms the above findings IMPRESSION: 1. Interval placement of a vascular  stent across the left carotid bifurcation/proximal left ICA. Widely patent flow seen through the stent without complication. 2. Otherwise negative CTA of the head and neck. No other acute intracranial abnormality. 3. Layering bilateral pleural effusions with mild pulmonary interstitial congestion/edema. Electronically Signed   By: Rise MuBenjamin  McClintock M.D.   On: 02/17/2022 03:43   DG CHEST PORT 1 VIEW  Result Date: 02/17/2022 CLINICAL DATA:  Shortness of breath EXAM: PORTABLE CHEST 1 VIEW COMPARISON:  None Available. FINDINGS: Peribronchial thickening and interstitial prominence. No confluent airspace opacities or effusions. Heart is normal size. No acute bony abnormality. IMPRESSION: Peribronchial thickening and interstitial prominence, likely bronchitic changes. Electronically Signed   By: Charlett NoseKevin  Dover M.D.   On: 02/17/2022 03:13   VAS US TRANSCRANIAL DOPPLER W BUBBLES  Result Date: 02/16/2022  Transcranial Doppler with Bubble Patient Name:  Erika Jordan  Date of Exam:   02/16/2022 Medical Rec #: [id number]     Accession #:    [id number] Date of Birth: [birth year]     Patient Gender: F Patient Age:   [age] years [facility] Procedure:      VAS US TRANSCRANIAL DOPPLER W BUBBLES Referring Phys: Scheryl MartenJINDONG XU --------------------------------------------------------------------------------  Indications: Stroke. History: Migraine headaches. Comparison Study: No prior study Performing Technologist: Gertie FeyMichelle Simonetti MHA, RDMS, RVT, RDCS  Examination Guidelines: A complete evaluation includes B-mode imaging, spectral Doppler, color Doppler, and power Doppler as needed of all accessible portions of each vessel. Bilateral testing is considered an integral part of a complete examination. Limited examinations for reoccurring indications may be performed as noted.  Summary: No HITS at rest or during Valsalva. Negative transcranial Doppler Bubble study with no evidence of right to left intracardiac  communication.  A vascular evaluation was performed. The left middle cerebral artery was studied. An IV was inserted into the patient's right Forearm. Verbal informed consent was obtained.  Negative TCD Bubble study without any evidence of right to left shunt noted *See table(s) above for TCD measurements and observations.  Diagnosing physician: Delia HeadyPramod Sethi MD Electronically signed by Delia HeadyPramod Sethi MD on 02/16/2022 at 4:54:56 PM.    Final     Assessment/Plan: Status post revascularization of proximal left internal carotid artery with stent assisted angioplasty Doing well neurologically.  States she is back to baseline.   Reports shortness of breath overnight after taking Brilinta.  Counseled on side effects of Brilinta can has been mitigated by some patients with 1/4-1/2c coffee 15-20 minutes prior to taking medicine.  Patient and husband verbalize understanding.  Patient is eager to d/c home.  She will need to be seen within the week by her PCP, then get a [place] follow[place]up and management     LOS: 4 days   I spent a total of 30 minutes in face to face in clinical consultation, greater than 50% of which was counseling/coordinating care for (L)ICA stent angioplasty  Hoyt KochKacie Sue-Ellen Juliani Laduke PA-C 02/17/2022 1:33 PM

## 2022-02-17 NOTE — Progress Notes (Signed)
Nursing Discharge Note   Admit Date: 02/13/2022  Discharge date: 02/17/2022   Eliza Costas is to be discharged home per MD order.  AVS completed. Reviewed with patient and family at bedside. Highlighted copy provided for patient to take home.  Patient/caregiver able to verbalize understanding of discharge instructions. PIV removed. Patient stable upon discharge. Provided with rosuvastatin  and ticagrelor pharmacy cards to assist with cost of medications.   Allergies as of 02/17/2022       Reactions   Penicillins Hives        Medication List     STOP taking these medications    ibuprofen 200 MG tablet Commonly known as: ADVIL       TAKE these medications    aspirin 81 MG chewable tablet Chew 1 tablet (81 mg total) by mouth daily. Start taking on: February 18, 2022   cetirizine 10 MG tablet Commonly known as: ZYRTEC Take 10 mg by mouth at bedtime.   ferrous sulfate 325 (65 FE) MG tablet Take 1 tablet (325 mg total) by mouth 3 (three) times daily with meals. Start taking on: February 18, 2022   rosuvastatin 20 MG tablet Commonly known as: CRESTOR Take 1 tablet (20 mg total) by mouth daily. Start taking on: February 18, 2022   ticagrelor 90 MG Tabs tablet Commonly known as: BRILINTA Take 1 tablet (90 mg total) by mouth 2 (two) times daily.         Discharge Instructions/ Education: Discharge instructions given to patient/family with verbalized understanding. Discharge education completed with patient/family including: follow up instructions, medication list, discharge activities, and limitations if indicated. Patient instructed to return to Emergency Department, call 911, or call MD for any changes in condition.  Patient escorted via wheelchair to lobby and discharged home via private automobile.

## 2022-02-17 NOTE — Progress Notes (Signed)
Patient complained of chest tightness and feeling of not able to breath. Patient's vitals within normal limits. Patient put on oxygen. Neurologist on call paged to notify. Awaiting reply.

## 2022-02-17 NOTE — Discharge Summary (Addendum)
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 Start taking on: February 18, 2022   cetirizine 10 MG tablet Commonly known as: ZYRTEC Take 10 mg by mouth at bedtime.   ferrous sulfate 325 (65 FE) MG tablet Take 1 tablet (325 mg total) by mouth 3 (three) times daily with meals. Start taking on: February 18, 2022   rosuvastatin 20 MG tablet Commonly known as: CRESTOR Take 1 tablet (20 mg total) by mouth daily. Start taking on: February 18, 2022   ticagrelor 90 MG Tabs tablet Commonly known as: BRILINTA Take 1 tablet (90 mg total) by mouth 2 (two) times daily.        LABORATORY STUDIES CBC    Component Value Date/Time   WBC 12.1 (H) 02/17/2022 0243   RBC 4.35 02/17/2022 0243   HGB 9.9 (L) 02/17/2022 0243   HCT 31.5 (L) 02/17/2022 0243   PLT 266 02/17/2022 0243   MCV 72.4 (L) 02/17/2022 0243   MCH 22.8 (L) 02/17/2022 0243   MCHC 31.4 02/17/2022 0243   RDW 19.9 (H) 02/17/2022 0243   LYMPHSABS 1.5 02/16/2022 0506   MONOABS 0.4 02/16/2022 0506   EOSABS 0.2 02/16/2022 0506   BASOSABS 0.0 02/16/2022 0506   CMP    Component Value Date/Time   NA 137 02/17/2022 0243   K 3.6 02/17/2022 0243    CL 111 02/17/2022 0243   CO2 19 (L) 02/17/2022 0243   GLUCOSE 104 (H) 02/17/2022 0243   BUN 5 (L) 02/17/2022 0243   CREATININE 0.73 02/17/2022 0243   CALCIUM 8.2 (L) 02/17/2022 0243   PROT 7.1 02/13/2022 2131   ALBUMIN 3.4 (L) 02/13/2022 2131   AST 21 02/13/2022 2131   ALT 18 02/13/2022 2131   ALKPHOS 91 02/13/2022 2131   BILITOT 0.5 02/13/2022 2131   GFRNONAA >60 02/17/2022 0243   COAGS Lab Results  Component Value Date   INR 1.0 02/13/2022   Lipid Panel    Component Value Date/Time   CHOL 203 (H) 02/14/2022 0304   TRIG 58 02/14/2022 0304   HDL 55 02/14/2022 0304   CHOLHDL 3.7 02/14/2022 0304   VLDL 12 02/14/2022 0304   LDLCALC 136 (H) 02/14/2022 0304   HgbA1C  Lab Results  Component Value Date   HGBA1C 5.6 02/13/2022   Urinalysis No results found for: COLORURINE, APPEARANCEUR, LABSPEC, PHURINE, GLUCOSEU, HGBUR, BILIRUBINUR, KETONESUR, PROTEINUR, UROBILINOGEN, NITRITE, LEUKOCYTESUR Urine Drug Screen     Component Value Date/Time   LABOPIA NONE DETECTED 02/14/2022 1523   COCAINSCRNUR NONE DETECTED 02/14/2022 1523   LABBENZ NONE DETECTED  02/14/2022 1523   AMPHETMU NONE DETECTED 02/14/2022 1523   THCU NONE DETECTED 02/14/2022 1523   LABBARB NONE DETECTED 02/14/2022 1523    Alcohol Level    Component Value Date/Time   ETH <10 02/13/2022 2131     SIGNIFICANT DIAGNOSTIC STUDIES CT ABDOMEN PELVIS WO CONTRAST  Result Date: 02/16/2022 CLINICAL DATA:  Abdominal pain.  Stroke with revascularization. EXAM: CT ABDOMEN AND PELVIS WITHOUT CONTRAST TECHNIQUE: Multidetector CT imaging of the abdomen and pelvis was performed following the standard protocol without IV contrast. RADIATION DOSE REDUCTION: This exam was performed according to the departmental dose-optimization program which includes automated exposure control, adjustment of the mA and/or kV according to patient size and/or use of iterative reconstruction technique. COMPARISON:  None FINDINGS: Lower chest: Mild  atelectasis at the lung bases.  No lobar collapse. Hepatobiliary: Normal without contrast. Vicarious excretion of contrast dependent in the gallbladder. No discrete stone. Pancreas: Normal Spleen: Normal Adrenals/Urinary Tract: Adrenal glands are normal. Kidneys are normal. Bladder is normal, containing some excreted contrast. Stomach/Bowel: Stomach and small intestine are normal. Appendix is normal. No colon pathology. Vascular/Lymphatic: Aorta and IVC are normal. No adenopathy. There is a small amount of extravascular blood along the right iliac vessels within the pelvic retroperitoneum. No sizable discrete hematoma. Reproductive: Normal Other: No free fluid or air. Musculoskeletal: Normal IMPRESSION: Small amount of extravascular extraperitoneal blood along the right iliac region without evidence of a large postprocedural hematoma. Otherwise negative. Electronically Signed   By: Paulina Fusi M.D.   On: 02/16/2022 10:24   CT ANGIO HEAD NECK W WO CM  Result Date: 02/17/2022 CLINICAL DATA:  Initial evaluation for stroke/TIA. EXAM: CT ANGIOGRAPHY HEAD AND NECK TECHNIQUE: Multidetector CT imaging of the head and neck was performed using the standard protocol during bolus administration of intravenous contrast. Multiplanar CT image reconstructions and MIPs were obtained to evaluate the vascular anatomy. Carotid stenosis measurements (when applicable) are obtained utilizing NASCET criteria, using the distal internal carotid diameter as the denominator. RADIATION DOSE REDUCTION: This exam was performed according to the departmental dose-optimization program which includes automated exposure control, adjustment of the mA and/or kV according to patient size and/or use of iterative reconstruction technique. CONTRAST:  65mL OMNIPAQUE IOHEXOL 350 MG/ML SOLN COMPARISON:  Prior study from 02/15/2022. FINDINGS: CT HEAD FINDINGS Brain: Cerebral volume within normal limits for patient age. No evidence for acute intracranial  hemorrhage. No findings to suggest acute large vessel territory infarct. No mass lesion, midline shift, or mass effect. Ventricles are normal in size without evidence for hydrocephalus. No extra-axial fluid collection identified. Vascular: No hyperdense vessel identified. Skull: Scalp soft tissues demonstrate no acute abnormality. Calvarium intact. Sinuses/Orbits: Globes and orbital soft tissues within normal limits. Mild-to-moderate mucosal thickening noted about the ethmoidal air cells and maxillary sinuses. No mastoid effusion. CTA NECK FINDINGS Aortic arch: Visualized aortic arch normal caliber with normal branch pattern. No stenosis about the origin the great vessels. Right carotid system: Right common and internal carotid arteries widely patent without stenosis, dissection or occlusion. Left carotid system: Interval placement of a vascular stent across the left carotid bifurcation. Widely patent flow seen through the stent. Left carotid artery system remains widely patent elsewhere without stenosis or dissection. Vertebral arteries: Both vertebral arteries arise from the subclavian arteries. No proximal subclavian artery stenosis. Both vertebral arteries widely patent without stenosis, dissection or occlusion. Skeleton: Degenerative spondylosis at C5-6 and C6-7. Other neck: No other acute finding within the neck. Upper chest: Layering bilateral pleural effusions, right slightly  larger than left. Mild pulmonary interstitial edema/congestion within the visualized lungs. Review of the MIP images confirms the above findings CTA HEAD FINDINGS Anterior circulation: Both internal carotid arteries widely patent to the termini without stenosis. A1 segments widely patent. Normal anterior communicating artery complex. Both anterior cerebral arteries widely patent to their distal aspects without stenosis. No M1 stenosis or occlusion. Normal MCA bifurcations. Distal MCA branches well perfused and symmetric. Posterior  circulation: Both V4 segments patent to the vertebrobasilar junction without stenosis. Both PICA origins patent and normal. Basilar widely patent to its distal aspect without stenosis. Superior cerebellar arteries patent bilaterally. Both PCAs widely patent and well perfused to their distal aspects. Venous sinuses: Patent. Anatomic variants: None significant.  No aneurysm. Review of the MIP images confirms the above findings IMPRESSION: 1. Interval placement of a vascular stent across the left carotid bifurcation/proximal left ICA. Widely patent flow seen through the stent without complication. 2. Otherwise negative CTA of the head and neck. No other acute intracranial abnormality. 3. Layering bilateral pleural effusions with mild pulmonary interstitial congestion/edema. Electronically Signed   By: Rise Mu M.D.   On: 02/17/2022 03:43   CT HEAD WO CONTRAST ( )  Result Date: 02/15/2022 CLINICAL DATA:  Stroke, follow up EXAM: CT HEAD WITHOUT CONTRAST TECHNIQUE: Contiguous axial images were obtained from the base of the skull through the vertex without intravenous contrast. RADIATION DOSE REDUCTION: This exam was performed according to the departmental dose-optimization program which includes automated exposure control, adjustment of the mA and/or kV according to patient size and/or use of iterative reconstruction technique. COMPARISON:  Feb 13, 2022. FINDINGS: Brain: No evidence of acute large vascular territory infarction, hemorrhage, hydrocephalus, extra-axial collection or mass lesion/mass effect. Vascular: No definite hyperdense vessel identified. Skull: No acute fracture. Sinuses/Orbits: Moderate paranasal sinus mucosal thickening. Other: No mastoid effusions. IMPRESSION: No evidence of acute intracranial abnormality. Electronically Signed   By: Feliberto Harts M.D.   On: 02/15/2022 11:57   CT ANGIO NECK W OR WO CONTRAST  Result Date: 02/15/2022 CLINICAL DATA:  51 year old female status  post code stroke presentation on 02/13/2022, evidence of left ICA thrombus on CTA. Punctate left hemisphere infarcts on MRI at that time. Recurrent right extremity symptoms, word finding difficulty. EXAM: CT ANGIOGRAPHY NECK TECHNIQUE: Multidetector CT imaging of the neck was performed using the standard protocol during bolus administration of intravenous contrast. Multiplanar CT image reconstructions and MIPs were obtained to evaluate the vascular anatomy. Carotid stenosis measurements (when applicable) are obtained utilizing NASCET criteria, using the distal internal carotid diameter as the denominator. RADIATION DOSE REDUCTION: This exam was performed according to the departmental dose-optimization program which includes automated exposure control, adjustment of the mA and/or kV according to patient size and/or use of iterative reconstruction technique. CONTRAST:  75mL OMNIPAQUE IOHEXOL 350 MG/ML SOLN COMPARISON:  Brain MRI today.  Recent CTA head and neck 02/13/2022. FINDINGS: Skeleton: No acute osseous abnormality identified. Cervical spine disc and endplate degeneration. Upper chest: Stable, negative. Visible central pulmonary arteries appear patent. Other neck: Stable, negative. Negative visible posterior fossa. Aortic arch: 3 vessel arch configuration. Minimal arch atherosclerosis only in the distal arch. Small prominent possible bronchial artery origin on series 5, image 97 is stable. Right carotid system: Stable and negative. Visible right ICA siphon appears patent and negative. Left carotid system: Negative left CCA origin. Left CCA is within normal limits before the bifurcation. But at the carotid bifurcation elongated low-density filling defect tracking into the left ICA bulb has increased most resembling  adherent thrombus (series 5, image 45 versus series 5 image 101 previously, now measuring between 5 and 10 mm and possibly associated with conspicuous low-density plaque or clot at the posterior left  ICA origin (series 5, image 47). There is now some hemodynamically significant stenosis around the clot on series 5, image 45, but distal to that the left ICA is patent without irregularity through the petrous left ICA siphon. Vertebral arteries: Stable and negative. Codominant vertebral arteries without stenosis to the vertebrobasilar junction. Patent PICA origins. Anatomic variants: None in the neck. Review of the MIP images confirms the above findings IMPRESSION: 1. Enlarged thrombus within the Left ICA origin since the CTA on 02/13/2022. See series 5, image 45. This might be clot adherent to low-density plaque at the posterior left carotid bifurcation. 2. But elsewhere CTA of the Neck remains negative. Study discussed by telephone with Dr. Iver Nestle on 02/15/2022 at 05:55 . Electronically Signed   By: Odessa Fleming M.D.   On: 02/15/2022 05:56   MR BRAIN WO CONTRAST  Result Date: 02/15/2022 CLINICAL DATA:  51 year old female status post code stroke presentation on 02/13/2022, evidence of left ICA thrombus on CTA. Punctate left hemisphere infarcts on MRI at that time. Recurrent right extremity symptoms, word finding difficulty. EXAM: MRI HEAD WITHOUT CONTRAST TECHNIQUE: Multiplanar, multiecho pulse sequences of the brain and surrounding structures were obtained without intravenous contrast. COMPARISON:  Brain MRI 02/13/2022 and earlier. FINDINGS: Brain: Scattered punctate and occasionally patchy foci of restricted diffusion in the left hemisphere are increased in conspicuity since 02/13/2022. Involvement in the left frontal lobe including the anterior and posterior middle frontal gyrus (series 7, image 71), left perirolandic area superiorly (series 5, image 98), left parietal lobe, superior left occipital lobe along the left parietooccipital sulcus (series 5, image 85), and also the left occipital pole (series 5, image 76). Left posterior communicating artery present on the recent CTA. Deep gray nuclei are spared. No  contralateral right hemisphere or posterior fossa restricted diffusion identified. Mild T2 and FLAIR hyperintensity in the affected areas (left perirolandic cortex and subcortical white matter series 11, image 21). No acute intracranial hemorrhage identified. Scattered additional nonspecific cerebral white matter T2 and FLAIR hyperintensity appears stable. No chronic cerebral blood products identified on SWI. No midline shift, mass effect, evidence of mass lesion, ventriculomegaly, extra-axial collection. Cervicomedullary junction and pituitary are within normal limits. Vascular: Major intracranial vascular flow voids are preserved, stable. Skull and upper cervical spine: Negative. Sinuses/Orbits: Stable paranasal sinus mucosal thickening. Orbits appear stable and negative. Other: Mastoids remain clear.  Negative visible scalp and face. IMPRESSION: 1. Increased conspicuity and mild progression of scattered but mostly punctate infarcts in the Left cerebral hemisphere. Involvement in both the Left MCA and PCA territories now (newly including the left occipital pole). 2. No associated hemorrhage or mass effect. No other vascular territories affected. Electronically Signed   By: Odessa Fleming M.D.   On: 02/15/2022 05:07   MR BRAIN WO CONTRAST  Result Date: 02/13/2022 CLINICAL DATA:  Right facial droop, right-sided weakness EXAM: MRI HEAD WITHOUT CONTRAST TECHNIQUE: Multiplanar, multiecho pulse sequences of the brain and surrounding structures were obtained without intravenous contrast. COMPARISON:  No prior MRI, correlation is made with 02/13/2022 CT head and CTA head neck FINDINGS: Evaluation is somewhat limited by early termination of the study. No axial T1 sequence was obtained. Brain: Punctate foci of restricted diffusion in the left parietal lobe (series 5, images 79-80) and left frontal lobe (series 5, images 81-85 and 88).  No acute hemorrhage, mass, mass effect, or midline shift. No hydrocephalus or extra-axial  collection. Scattered T2 hyperintense signal in the periventricular white matter, likely the sequela of chronic small vessel ischemic disease. Vascular: Normal flow voids. Skull and upper cervical spine: Normal marrow signal. Sinuses/Orbits: Mucosal thickening in the left frontal sinus, ethmoid air cells, and maxillary sinuses. Other: The mastoids are well aerated. 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 Electronically Signed   By: Wiliam Ke M.D.   On: 02/13/2022 23:00   IR INTRAVSC STENT CERV CAROTID W/EMB-PROT MOD SED  Result Date: 02/16/2022 INDICATION: Intermittent worsening aphasia, and right-sided weakness associated with right-sided numbness and tingling. 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 EXAM: 1. EMERGENT LARGE VESSEL OCCLUSION THROMBOLYSIS anterior CIRCULATION) COMPARISON:  None available. MEDICATIONS: Vancomycin 1 g IV antibiotic was administered within 1 hour of the procedure. ANESTHESIA/SEDATION: General anesthesia. CONTRAST:  Omnipaque 300 approximately 105 cc. FLUOROSCOPY TIME:  Fluoroscopy Time: 16 minutes 3 seconds (1216 mGy). COMPLICATIONS: None immediate. TECHNIQUE: Following a full explanation of the procedure along with the potential associated complications, an informed witnessed consent was obtained. The risks of intracranial hemorrhage of 10%, worsening neurological deficit, ventilator dependency, death and inability to revascularize were all reviewed in detail with the patient's spouse. The patient was then put under general anesthesia by the Department of Anesthesiology at [facility]. The right groin was prepped and draped in the usual sterile fashion. Thereafter using modified Seldinger technique, transfemoral access into the right common femoral artery was obtained without difficulty. Over a 0.035 inch guidewire an 8 French 25  cm Pinnacle sheath was inserted. Through this, and also over a 0.035 inch guidewire a combination of a 6 [place] Berenstein support catheter inside of an 087 95 cm balloon guide catheter was advanced and positioned in the distal left common carotid artery. The guidewire, and the 125 cm support catheter were removed. Good aspiration obtained from the hub of the balloon guide catheter. Gentle control arteriogram performed through this demonstrated no evidence of spasms, dissections or of intraluminal filling defects. A control DSA was then performed centered extra cranially and intracranially in the left common carotid artery. FINDINGS: Left common carotid arteriogram demonstrates the left external carotid artery and its major branches to be widely patent. The left internal carotid artery at the bulb demonstrates a lobulated irregular heterogeneous filling defect with a stenosis of approximately 80% on the lateral DSA. More distally, the left internal carotid artery is seen to opacify to the cranial skull base. The petrous, the cavernous and the supraclinoid segments demonstrate wide patency. Arising in the PCOM region is an approximate 3.2 mm x 2.1 mm saccular outpouching separate from the adjacent left posterior communicating artery. The left middle cerebral artery and the left anterior cerebral artery opacify into the capillary and venous phases. No angiographic evidence of intraluminal filling defects or of occlusions is noted. PROCEDURE: A NAV 4/7 mm Emboshield distal protection device was then flushed occluded in its housing. The filter device was then captured into the delivery microcatheter. The combination of the delivery microcatheter with the 014 inch micro guidewire with a moderate J configuration was then advanced to the distal end of the balloon guide catheter. Using a torque device, the micro guidewire was then gently advanced along the anterior border of the carotid bulb followed by the microcatheter.  The combination was then advanced to the cervical  petrous junction of the left internal carotid artery. Filter was then deployed under biplane fluoroscopy. With the filter stable, the delivery microcatheter was retrieved and removed. A control arteriogram was then performed through the balloon guide catheter in the left common carotid artery. This demonstrated moderate spasm in the distal left ICA cervical region. This responded to aliquot of a 25 mcg of nitroglycerin intra-arterially. Control arteriogram performed through the balloon guide catheter intracranially demonstrated no evidence of intraluminal filling defects or of occlusions. Measurements were then performed of the left internal carotid artery just distal to the carotid bulb, and proximally into the distal left common carotid artery. A 4 mm x 30 mm Viatrac 14 balloon microcatheter was then prepped and purged with heparinized saline infusion retrogradely, and with 50% contrast and 50% heparinized saline infusion antegradely. Using the rapid exchange technique, the balloon was then advanced to the proximal left internal carotid artery in the distal left common carotid artery. Proximal flow arrest was then initiated by inflating the balloon in the distal left common carotid artery, with aspiration with a 20 mL syringe through the side port of the balloon guide catheter as a control angioplasty was then performed using micro inflation syringe device via micro tubing to approximately 9 atmospheres achieving a diameter of just over 4 mm for about a minute. No hemodynamic changes were encountered. Balloons were deflated and retrieved. Flow arrest was reversed as was the aspiration. A control arteriogram performed through the balloon guide catheter demonstrated improved caliber and flow through the proximal left internal carotid artery. A 9-7 mm x 40 mm Xact stent was then chosen for delivery. This was then retrogradely flushed with heparinized saline infusion.  Using the rapid exchange technique, the stent delivery apparatus was then advanced distally, and distal and proximal markers were positioned in the desired position. This was then deployed in the usual manner without any difficulty. With the distal protection device held stable, the delivery apparatus was retrieved and removed. A control arteriogram performed through the balloon guide catheter in the distal left common carotid artery demonstrated excellent apposition and near normal caliber of the stented segment of the left internal carotid artery. A control arteriogram performed intracranially continued to demonstrate no evidence of intraluminal filling defects or of occlusions. A CT of the brain was then performed which demonstrated no evidence of intracranial hemorrhage. At this time, the patient was given half a bolus dose of IV cangrelor followed by a 4 hour infusion. At approximately 25 minutes post deployment of the stent, the filter was captured into the filter capture microcatheter which was advanced again using the rapid exchange technique to the proximal marker. The filter device was captured by retrieving the micro guidewire into the capture device and removed together under constant fluoroscopic guidance. Biplane DSA demonstrated continued excellent apposition and flow through the proximal left internal carotid artery without any evidence of intraluminal filling defects. Balloon catheter was then retrieved and removed. A 5 French diagnostic catheter was now advanced into the right common carotid artery. A control arteriogram performed through this demonstrated the right external carotid artery and its major branches to be widely patent. The right internal carotid artery at the bulb to the cranial skull base demonstrates wide patency. The petrous, the cavernous and the supraclinoid segments demonstrated adequate caliber and flow intracranially. A small 2 mm infundibulum is noted in the PCOM region of  the right internal carotid artery. The right middle and the right anterior cerebral arteries opacify into the capillary and  venous phases. The diagnostic catheter was removed. An 8 French Angio-Seal closure device was then deployed for hemostasis at the right groin puncture site. Both the posterior tibial pulses were palpable. The dorsalis pedis pulses remained undopplerable unchanged from prior to the procedure. A flat panel CT of the brain demonstrated no evidence of hemorrhagic complications. The patient's general anesthesia was reversed, and patient was extubated without difficulty. Upon recovery, the patient denied any headaches, nausea or vomiting. She was able to move all fours equally. She was able to verbalize appropriately. She was then transferred to the neuro ICU for post revascularization care. 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 PLAN: Follow-up in the clinic 2 weeks post discharge. Electronically Signed   By: Julieanne Cotton M.D.   On: 02/16/2022 09:05   IR CT Head Ltd  Result Date: 02/16/2022 INDICATION: Intermittent worsening aphasia, and right-sided weakness associated with right-sided numbness and tingling. 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 EXAM: 1. EMERGENT LARGE VESSEL OCCLUSION THROMBOLYSIS anterior CIRCULATION) COMPARISON:  None available. MEDICATIONS: Vancomycin 1 g IV antibiotic was administered within 1 hour of the procedure. ANESTHESIA/SEDATION: General anesthesia. CONTRAST:  Omnipaque 300 approximately 105 cc. FLUOROSCOPY TIME:  Fluoroscopy Time: 16 minutes 3 seconds (1216 mGy). COMPLICATIONS: None immediate. TECHNIQUE:  Following a full explanation of the procedure along with the potential associated complications, an informed witnessed consent was obtained. The risks of intracranial hemorrhage of 10%, worsening neurological deficit, ventilator dependency, death and inability to revascularize were all reviewed in detail with the patient's spouse. The patient was then put under general anesthesia by the Department of Anesthesiology at [facility]. The right groin was prepped and draped in the usual sterile fashion. Thereafter using modified Seldinger technique, transfemoral access into the right common femoral artery was obtained without difficulty. Over a 0.035 inch guidewire an 8 French 25 cm Pinnacle sheath was inserted. Through this, and also over a 0.035 inch guidewire a combination of a 6 [place] Berenstein support catheter inside of an 087 95 cm balloon guide catheter was advanced and positioned in the distal left common carotid artery. The guidewire, and the 125 cm support catheter were removed. Good aspiration obtained from the hub of the balloon guide catheter. Gentle control arteriogram performed through this demonstrated no evidence of spasms, dissections or of intraluminal filling defects. A control DSA was then performed centered extra cranially and intracranially in the left common carotid artery. FINDINGS: Left common carotid arteriogram demonstrates the left external carotid artery and its major branches to be widely patent. The left internal carotid artery at the bulb demonstrates a lobulated irregular heterogeneous filling defect with a stenosis of approximately 80% on the lateral DSA. More distally, the left internal carotid artery is seen to opacify to the cranial skull base. The petrous, the cavernous and the supraclinoid segments demonstrate wide patency. Arising in the PCOM region is an approximate 3.2 mm x 2.1 mm saccular outpouching separate from the adjacent left posterior communicating artery. The  left middle cerebral artery and the left anterior cerebral artery opacify into the capillary and venous phases. No angiographic evidence of intraluminal filling defects or of occlusions is noted. PROCEDURE: A NAV 4/7 mm Emboshield distal protection device was then flushed  occluded in its housing. The filter device was then captured into the delivery microcatheter. The combination of the delivery microcatheter with the 014 inch micro guidewire with a moderate J configuration was then advanced to the distal end of the balloon guide catheter. Using a torque device, the micro guidewire was then gently advanced along the anterior border of the carotid bulb followed by the microcatheter. The combination was then advanced to the cervical petrous junction of the left internal carotid artery. Filter was then deployed under biplane fluoroscopy. With the filter stable, the delivery microcatheter was retrieved and removed. A control arteriogram was then performed through the balloon guide catheter in the left common carotid artery. This demonstrated moderate spasm in the distal left ICA cervical region. This responded to aliquot of a 25 mcg of nitroglycerin intra-arterially. Control arteriogram performed through the balloon guide catheter intracranially demonstrated no evidence of intraluminal filling defects or of occlusions. Measurements were then performed of the left internal carotid artery just distal to the carotid bulb, and proximally into the distal left common carotid artery. A 4 mm x 30 mm Viatrac 14 balloon microcatheter was then prepped and purged with heparinized saline infusion retrogradely, and with 50% contrast and 50% heparinized saline infusion antegradely. Using the rapid exchange technique, the balloon was then advanced to the proximal left internal carotid artery in the distal left common carotid artery. Proximal flow arrest was then initiated by inflating the balloon in the distal left common carotid  artery, with aspiration with a 20 mL syringe through the side port of the balloon guide catheter as a control angioplasty was then performed using micro inflation syringe device via micro tubing to approximately 9 atmospheres achieving a diameter of just over 4 mm for about a minute. No hemodynamic changes were encountered. Balloons were deflated and retrieved. Flow arrest was reversed as was the aspiration. A control arteriogram performed through the balloon guide catheter demonstrated improved caliber and flow through the proximal left internal carotid artery. A 9-7 mm x 40 mm Xact stent was then chosen for delivery. This was then retrogradely flushed with heparinized saline infusion. Using the rapid exchange technique, the stent delivery apparatus was then advanced distally, and distal and proximal markers were positioned in the desired position. This was then deployed in the usual manner without any difficulty. With the distal protection device held stable, the delivery apparatus was retrieved and removed. A control arteriogram performed through the balloon guide catheter in the distal left common carotid artery demonstrated excellent apposition and near normal caliber of the stented segment of the left internal carotid artery. A control arteriogram performed intracranially continued to demonstrate no evidence of intraluminal filling defects or of occlusions. A CT of the brain was then performed which demonstrated no evidence of intracranial hemorrhage. At this time, the patient was given half a bolus dose of IV cangrelor followed by a 4 hour infusion. At approximately 25 minutes post deployment of the stent, the filter was captured into the filter capture microcatheter which was advanced again using the rapid exchange technique to the proximal marker. The filter device was captured by retrieving the micro guidewire into the capture device and removed together under constant fluoroscopic guidance. Biplane DSA  demonstrated continued excellent apposition and flow through the proximal left internal carotid artery without any evidence of intraluminal filling defects. Balloon catheter was then retrieved and removed. A 5 French diagnostic catheter was now advanced into the right common carotid artery. A control arteriogram performed through this demonstrated  the right external carotid artery and its major branches to be widely patent. The right internal carotid artery at the bulb to the cranial skull base demonstrates wide patency. The petrous, the cavernous and the supraclinoid segments demonstrated adequate caliber and flow intracranially. A small 2 mm infundibulum is noted in the PCOM region of the right internal carotid artery. The right middle and the right anterior cerebral arteries opacify into the capillary and venous phases. The diagnostic catheter was removed. An 8 French Angio-Seal closure device was then deployed for hemostasis at the right groin puncture site. Both the posterior tibial pulses were palpable. The dorsalis pedis pulses remained undopplerable unchanged from prior to the procedure. A flat panel CT of the brain demonstrated no evidence of hemorrhagic complications. The patient's general anesthesia was reversed, and patient was extubated without difficulty. Upon recovery, the patient denied any headaches, nausea or vomiting. She was able to move all fours equally. She was able to verbalize appropriately. She was then transferred to the neuro ICU for post revascularization care. 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 PLAN: Follow-up in the clinic 2 weeks post discharge. Electronically Signed   By: Julieanne Cotton M.D.   On: 02/16/2022 09:05   IR CT Head  [facility]  Result Date: 02/16/2022 INDICATION: Intermittent worsening aphasia, and right-sided weakness associated with right-sided numbness and tingling. 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 EXAM: 1. EMERGENT LARGE VESSEL OCCLUSION THROMBOLYSIS anterior CIRCULATION) COMPARISON:  None available. MEDICATIONS: Vancomycin 1 g IV antibiotic was administered within 1 hour of the procedure. ANESTHESIA/SEDATION: General anesthesia. CONTRAST:  Omnipaque 300 approximately 105 cc. FLUOROSCOPY TIME:  Fluoroscopy Time: 16 minutes 3 seconds (1216 mGy). COMPLICATIONS: None immediate. TECHNIQUE: Following a full explanation of the procedure along with the potential associated complications, an informed witnessed consent was obtained. The risks of intracranial hemorrhage of 10%, worsening neurological deficit, ventilator dependency, death and inability to revascularize were all reviewed in detail with the patient's spouse. The patient was then put under general anesthesia by the Department of Anesthesiology at [facility]. The right groin was prepped and draped in the usual sterile fashion. Thereafter using modified Seldinger technique, transfemoral access into the right common femoral artery was obtained without difficulty. Over a 0.035 inch guidewire an 8 French 25 cm Pinnacle sheath was inserted. Through this, and also over a 0.035 inch guidewire a combination of a 6 [place] Berenstein support catheter inside of an 087 95 cm balloon guide catheter was advanced and positioned in the distal left common carotid artery. The guidewire, and the 125 cm support catheter were removed. Good aspiration obtained from the hub of the balloon guide catheter. Gentle control arteriogram performed through this demonstrated no evidence of spasms, dissections or of intraluminal filling defects. A control DSA was then performed centered  extra cranially and intracranially in the left common carotid artery. FINDINGS: Left common carotid arteriogram demonstrates the left external carotid artery and its major branches to be widely patent. The left internal carotid artery at the bulb demonstrates a lobulated irregular heterogeneous filling defect with a stenosis of approximately 80% on the lateral DSA. More distally, the left internal carotid artery is seen to opacify to the cranial skull base. The petrous,  the cavernous and the supraclinoid segments demonstrate wide patency. Arising in the PCOM region is an approximate 3.2 mm x 2.1 mm saccular outpouching separate from the adjacent left posterior communicating artery. The left middle cerebral artery and the left anterior cerebral artery opacify into the capillary and venous phases. No angiographic evidence of intraluminal filling defects or of occlusions is noted. PROCEDURE: A NAV 4/7 mm Emboshield distal protection device was then flushed occluded in its housing. The filter device was then captured into the delivery microcatheter. The combination of the delivery microcatheter with the 014 inch micro guidewire with a moderate J configuration was then advanced to the distal end of the balloon guide catheter. Using a torque device, the micro guidewire was then gently advanced along the anterior border of the carotid bulb followed by the microcatheter. The combination was then advanced to the cervical petrous junction of the left internal carotid artery. Filter was then deployed under biplane fluoroscopy. With the filter stable, the delivery microcatheter was retrieved and removed. A control arteriogram was then performed through the balloon guide catheter in the left common carotid artery. This demonstrated moderate spasm in the distal left ICA cervical region. This responded to aliquot of a 25 mcg of nitroglycerin intra-arterially. Control arteriogram performed through the balloon guide catheter  intracranially demonstrated no evidence of intraluminal filling defects or of occlusions. Measurements were then performed of the left internal carotid artery just distal to the carotid bulb, and proximally into the distal left common carotid artery. A 4 mm x 30 mm Viatrac 14 balloon microcatheter was then prepped and purged with heparinized saline infusion retrogradely, and with 50% contrast and 50% heparinized saline infusion antegradely. Using the rapid exchange technique, the balloon was then advanced to the proximal left internal carotid artery in the distal left common carotid artery. Proximal flow arrest was then initiated by inflating the balloon in the distal left common carotid artery, with aspiration with a 20 mL syringe through the side port of the balloon guide catheter as a control angioplasty was then performed using micro inflation syringe device via micro tubing to approximately 9 atmospheres achieving a diameter of just over 4 mm for about a minute. No hemodynamic changes were encountered. Balloons were deflated and retrieved. Flow arrest was reversed as was the aspiration. A control arteriogram performed through the balloon guide catheter demonstrated improved caliber and flow through the proximal left internal carotid artery. A 9-7 mm x 40 mm Xact stent was then chosen for delivery. This was then retrogradely flushed with heparinized saline infusion. Using the rapid exchange technique, the stent delivery apparatus was then advanced distally, and distal and proximal markers were positioned in the desired position. This was then deployed in the usual manner without any difficulty. With the distal protection device held stable, the delivery apparatus was retrieved and removed. A control arteriogram performed through the balloon guide catheter in the distal left common carotid artery demonstrated excellent apposition and near normal caliber of the stented segment of the left internal carotid artery.  A control arteriogram performed intracranially continued to demonstrate no evidence of intraluminal filling defects or of occlusions. A CT of the brain was then performed which demonstrated no evidence of intracranial hemorrhage. At this time, the patient was given half a bolus dose of IV cangrelor followed by a 4 hour infusion. At approximately 25 minutes post deployment of the stent, the filter was captured into the filter capture microcatheter which was advanced again using the rapid exchange technique to  the proximal marker. The filter device was captured by retrieving the micro guidewire into the capture device and removed together under constant fluoroscopic guidance. Biplane DSA demonstrated continued excellent apposition and flow through the proximal left internal carotid artery without any evidence of intraluminal filling defects. Balloon catheter was then retrieved and removed. A 5 French diagnostic catheter was now advanced into the right common carotid artery. A control arteriogram performed through this demonstrated the right external carotid artery and its major branches to be widely patent. The right internal carotid artery at the bulb to the cranial skull base demonstrates wide patency. The petrous, the cavernous and the supraclinoid segments demonstrated adequate caliber and flow intracranially. A small 2 mm infundibulum is noted in the PCOM region of the right internal carotid artery. The right middle and the right anterior cerebral arteries opacify into the capillary and venous phases. The diagnostic catheter was removed. An 8 French Angio-Seal closure device was then deployed for hemostasis at the right groin puncture site. Both the posterior tibial pulses were palpable. The dorsalis pedis pulses remained undopplerable unchanged from prior to the procedure. A flat panel CT of the brain demonstrated no evidence of hemorrhagic complications. The patient's general anesthesia was reversed, and  patient was extubated without difficulty. Upon recovery, the patient denied any headaches, nausea or vomiting. She was able to move all fours equally. She was able to verbalize appropriately. She was then transferred to the neuro ICU for post revascularization care. 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 PLAN: Follow-up in the clinic 2 weeks post discharge. Electronically Signed   By: Julieanne Cotton M.D.   On: 02/16/2022 09:05   DG CHEST PORT 1 VIEW  Result Date: 02/17/2022 CLINICAL DATA:  Shortness of breath EXAM: PORTABLE CHEST 1 VIEW COMPARISON:  None Available. FINDINGS: Peribronchial thickening and interstitial prominence. No confluent airspace opacities or effusions. Heart is normal size. No acute bony abnormality. IMPRESSION: Peribronchial thickening and interstitial prominence, likely bronchitic changes. Electronically Signed   By: Charlett Nose M.D.   On: 02/17/2022 03:13   VAS [place] TRANSCRANIAL DOPPLER W BUBBLES  Result Date: 02/16/2022  Transcranial Doppler with Bubble Patient Name:  KAITLYN FRANKO  Date of Exam:   02/16/2022 Medical Rec #: [id number]     Accession #:    [id number] Date of Birth: 1971/02/03     Patient Gender: F Patient Age:   1 years Exam Location:  [facility] Procedure:      VAS [place] TRANSCRANIAL DOPPLER W BUBBLES Referring Phys: Scheryl Marten Brent Taillon --------------------------------------------------------------------------------  Indications: Stroke. History: Migraine headaches. Comparison Study: No prior study Performing Technologist: Gertie Fey MHA, RDMS, RVT, RDCS  Examination Guidelines: A complete evaluation includes B-mode imaging, spectral Doppler, color Doppler, and power Doppler as needed of all accessible portions of each  vessel. Bilateral testing is considered an integral part of a complete examination. Limited examinations for reoccurring indications may be performed as noted.  Summary: No HITS at rest or during Valsalva. Negative transcranial Doppler Bubble study with no evidence of right to left intracardiac communication.  A vascular evaluation was performed. The left middle cerebral artery was studied. An IV was inserted into the patient's right Forearm. Verbal informed consent was obtained.  Negative TCD Bubble study without any evidence of right to left shunt noted *See  table(s) above for TCD measurements and observations.  Diagnosing physician: Delia HeadyPramod Sethi MD Electronically signed by Delia HeadyPramod Sethi MD on 02/16/2022 at 4:54:56 PM.    Final    ECHOCARDIOGRAM COMPLETE  Result Date: 02/14/2022    ECHOCARDIOGRAM REPORT   Patient Name:   Emelia SalisburyJENNY Udell Date of Exam: 02/14/2022 Medical Rec #:  [id number]    Height: Accession #:    [id number]   Weight:       175.4 lb Date of Birth:  05-Feb-1971    BSA:          1.850 m Patient Age:    51 years     BP:           111/64 mmHg Patient Gender: F            HR:           62 bpm. Exam Location:  Inpatient Procedure: 2D Echo, Cardiac Doppler and Color Doppler Indications:    Stroke I63.9  History:        Patient has no prior history of Echocardiogram examinations.  Sonographer:    Leta Junglingiffany Cooper RDCS Referring Phys: [id number] SRISHTI L BHAGAT IMPRESSIONS  1. Left ventricular ejection fraction, by estimation, is 60 to 65%. The left ventricle has normal function. The left ventricle has no regional wall motion abnormalities. Left ventricular diastolic parameters were normal.  2. Right ventricular systolic function is normal. The right ventricular size is normal.  3. The mitral valve is normal in structure. Trivial mitral valve regurgitation. No evidence of mitral stenosis.  4. The aortic valve is tricuspid. Aortic valve regurgitation is not visualized. No aortic stenosis is present.  5. The  inferior vena cava is normal in size with greater than 50% respiratory variability, suggesting right atrial pressure of 3 mmHg. Comparison(s): No prior Echocardiogram. FINDINGS  Left Ventricle: Left ventricular ejection fraction, by estimation, is 60 to 65%. The left ventricle has normal function. The left ventricle has no regional wall motion abnormalities. The left ventricular internal cavity size was normal in size. There is  no left ventricular hypertrophy. Left ventricular diastolic parameters were normal. Right Ventricle: The right ventricular size is normal. Right ventricular systolic function is normal. Left Atrium: Left atrial size was normal in size. Right Atrium: Right atrial size was normal in size. Pericardium: There is no evidence of pericardial effusion. Mitral Valve: The mitral valve is normal in structure. Trivial mitral valve regurgitation. No evidence of mitral valve stenosis. Tricuspid Valve: The tricuspid valve is normal in structure. Tricuspid valve regurgitation is trivial. No evidence of tricuspid stenosis. Aortic Valve: The aortic valve is tricuspid. Aortic valve regurgitation is not visualized. No aortic stenosis is present. Pulmonic Valve: The pulmonic valve was not well visualized. Pulmonic valve regurgitation is trivial. No evidence of pulmonic stenosis. Aorta: The aortic root is normal in size and structure. Venous: The inferior vena cava is normal in size with greater than 50% respiratory variability, suggesting right atrial pressure of 3 mmHg. IAS/Shunts: The interatrial septum was not well visualized.  LEFT VENTRICLE PLAX 2D LVIDd:         4.60 cm   Diastology LVIDs:         2.10 cm   LV e' medial:    8.76 cm/s LV PW:         0.70 cm   LV E/e' medial:  10.9 LV IVS:        0.70 cm   LV e' lateral:   12.70 cm/s LVOT diam:  1.70 cm   LV E/e' lateral: 7.5 LV SV:         43 LV SV Index:   23 LVOT Area:     2.27 cm  RIGHT VENTRICLE RV S prime:     11.60 cm/s TAPSE (M-mode): 1.7 cm LEFT  ATRIUM             Index        RIGHT ATRIUM          Index LA diam:        3.00 cm 1.62 cm/m   RA Area:     8.81 cm LA Vol (A2C):   27.1 ml 14.65 ml/m  RA Volume:   15.50 ml 8.38 ml/m LA Vol (A4C):   32.9 ml 17.78 ml/m LA Biplane Vol: 31.6 ml 17.08 ml/m  AORTIC VALVE LVOT Vmax:   94.20 cm/s LVOT Vmean:  60.600 cm/s LVOT VTI:    0.188 m  AORTA Ao Root diam: 2.70 cm MITRAL VALVE MV Area (PHT): 3.63 cm    SHUNTS MV Decel Time: 209 msec    Systemic VTI:  0.19 m MV E velocity: 95.40 cm/s  Systemic Diam: 1.70 cm MV A velocity: 61.80 cm/s MV E/A ratio:  1.54 Olga Millers MD Electronically signed by Olga Millers MD Signature Date/Time: 02/14/2022/12:24:48 PM    Final    CT HEAD CODE STROKE WO CONTRAST  Result Date: 02/13/2022 CLINICAL DATA:  Code stroke.  Stroke suspected EXAM: CT HEAD WITHOUT CONTRAST TECHNIQUE: Contiguous axial images were obtained from the base of the skull through the vertex without intravenous contrast. RADIATION DOSE REDUCTION: This exam was performed according to the departmental dose-optimization program which includes automated exposure control, adjustment of the mA and/or kV according to patient size and/or use of iterative reconstruction technique. COMPARISON:  None Available. FINDINGS: Brain: No evidence of acute infarction, hemorrhage, cerebral edema, mass, mass effect, or midline shift. Ventricles and sulci are normal for age. No extra-axial fluid collection. Vascular: No hyperdense vessel. Skull: Negative for fracture or focal lesion. Sinuses/Orbits: Mucosal thickening in the maxillary sinuses, ethmoid air cells, and left frontal sinus. The orbits are unremarkable. Other: The mastoid air cells are well aerated. ASPECTS [facility] Stroke Program Early CT Score) - Ganglionic level infarction (caudate, lentiform nuclei, internal capsule, insula, M1-M3 cortex): 7 - Supraganglionic infarction (M4-M6 cortex): 3 Total score (0-10 with 10 being normal): 10 IMPRESSION: 1. No acute  intracranial process. 2. ASPECTS is 10 Code stroke imaging results were communicated on 02/13/2022 at 9:27 pm to provider BHAGAT via secure text paging. Electronically Signed   By: Wiliam Ke M.D.   On: 02/13/2022 21:28   VAS [place] LOWER EXTREMITY VENOUS (DVT)  Result Date: 02/14/2022  Lower Venous DVT Study Patient Name:  NIAJAH SIPOS  Date of Exam:   02/14/2022 Medical Rec #: [id number]     Accession #:    [id number] Date of Birth: 1971/06/17     Patient Gender: F Patient Age:   89 years Exam Location:  [facility] Procedure:      VAS [place] LOWER EXTREMITY VENOUS (DVT) Referring Phys: Scheryl Marten Laddie Math --------------------------------------------------------------------------------  Indications: Embolic stroke.  Comparison Study: No prior studies. Performing Technologist: Jean Rosenthal RDMS, RVT  Examination Guidelines: A complete evaluation includes B-mode imaging, spectral Doppler, color Doppler, and power Doppler as needed of all accessible portions of each vessel. Bilateral testing is considered an integral part of a complete examination. Limited examinations for reoccurring indications may be performed as noted. The reflux  portion of the exam is performed with the patient in reverse Trendelenburg.  +---------+---------------+---------+-----------+----------+--------------+ RIGHT    CompressibilityPhasicitySpontaneityPropertiesThrombus Aging +---------+---------------+---------+-----------+----------+--------------+ CFV      Full           Yes                                          +---------+---------------+---------+-----------+----------+--------------+ SFJ      Full                                                        +---------+---------------+---------+-----------+----------+--------------+ FV Prox  Full                                                        +---------+---------------+---------+-----------+----------+--------------+ FV Mid   Full                                                         +---------+---------------+---------+-----------+----------+--------------+ FV DistalFull                                                        +---------+---------------+---------+-----------+----------+--------------+ PFV      Full                                                        +---------+---------------+---------+-----------+----------+--------------+ POP      Full           Yes      Yes                                 +---------+---------------+---------+-----------+----------+--------------+ PTV      Full                                                        +---------+---------------+---------+-----------+----------+--------------+ PERO     Full                                                        +---------+---------------+---------+-----------+----------+--------------+ Gastroc  Full                                                        +---------+---------------+---------+-----------+----------+--------------+   +---------+---------------+---------+-----------+----------+--------------+  LEFT     CompressibilityPhasicitySpontaneityPropertiesThrombus Aging +---------+---------------+---------+-----------+----------+--------------+ CFV      Full           Yes      Yes                                 +---------+---------------+---------+-----------+----------+--------------+ SFJ      Full                                                        +---------+---------------+---------+-----------+----------+--------------+ FV Prox  Full                                                        +---------+---------------+---------+-----------+----------+--------------+ FV Mid   Full                                                        +---------+---------------+---------+-----------+----------+--------------+ FV DistalFull                                                         +---------+---------------+---------+-----------+----------+--------------+ PFV      Full                                                        +---------+---------------+---------+-----------+----------+--------------+ POP      Full           Yes      Yes                                 +---------+---------------+---------+-----------+----------+--------------+ PTV      Full                                                        +---------+---------------+---------+-----------+----------+--------------+ PERO     Full                                                        +---------+---------------+---------+-----------+----------+--------------+ Gastroc  Full                                                        +---------+---------------+---------+-----------+----------+--------------+  Summary: RIGHT: - There is no evidence of deep vein thrombosis in the lower extremity.  - No cystic structure found in the popliteal fossa.  LEFT: - There is no evidence of deep vein thrombosis in the lower extremity.  - No cystic structure found in the popliteal fossa.  *See table(s) above for measurements and observations. Electronically signed by Lemar Livings MD on 02/14/2022 at 12:57:14 PM.    Final    CT ANGIO HEAD NECK W WO CM W PERF (CODE STROKE)  Result Date: 02/13/2022 CLINICAL DATA:  Right facial droop, right-sided weakness, stroke suspected EXAM: CT ANGIOGRAPHY HEAD AND NECK CT PERFUSION TECHNIQUE: Multidetector CT imaging of the head and neck was performed using the standard protocol during bolus administration of intravenous contrast. Multiplanar CT image reconstructions and MIPs were obtained to evaluate the vascular anatomy. Carotid stenosis measurements (when applicable) are obtained utilizing NASCET criteria, using the distal internal carotid diameter as the denominator. Multiphase CT imaging of the brain was performed following IV bolus contrast injection. Subsequent  parametric perfusion maps were calculated using RAPID software. RADIATION DOSE REDUCTION: This exam was performed according to the departmental dose-optimization program which includes automated exposure control, adjustment of the mA and/or kV according to patient size and/or use of iterative reconstruction technique. CONTRAST:  100 mL Omnipaque 350 COMPARISON:  No prior CTA, correlation is made with CT head 02/13/2022 FINDINGS: CT HEAD FINDINGS For noncontrast findings, please see same day CT head. CTA NECK FINDINGS Aortic arch: Standard branching. Imaged portion shows no evidence of aneurysm or dissection. No significant stenosis of the major arch vessel origins. Right carotid system: No evidence of dissection, occlusion, or hemodynamically significant stenosis (greater than 50%). Left carotid system: Filling defect along the medial aspect of the proximal left ICA, extending into the lumen (series 7, images 202-204), which may represent intraluminal thrombus. No evidence of dissection, occlusion, or hemodynamically significant stenosis (greater than 50%). Vertebral arteries: No evidence of dissection, occlusion, or hemodynamically significant stenosis (greater than 50%). Skeleton: No acute osseous abnormality. Other neck: No acute finding. Upper chest: No focal pulmonary opacity or pleural effusion. Review of the MIP images confirms the above findings CTA HEAD FINDINGS Anterior circulation: Both internal carotid arteries are patent to the termini, without significant stenosis. A1 segments patent. Normal anterior communicating artery. Anterior cerebral arteries are patent to their distal aspects. No M1 stenosis or occlusion, with irregularity of the left-greater-than-right M1 normal MCA bifurcations. Distal MCA branches perfused and symmetric. Posterior circulation: Vertebral arteries patent to the vertebrobasilar junction without stenosis. Posterior inferior cerebellar arteries patent proximally. Basilar patent to  its distal aspect. Superior cerebellar arteries patent proximally. Patent P1 segments. PCAs perfused to their distal aspects without stenosis. The right posterior communicating artery is patent. Venous sinuses: As permitted by contrast timing, patent. Anatomic variants: None significant. Review of the MIP images confirms the above findings CT Brain Perfusion Findings: ASPECTS: 10 CBF (<30%) Volume: 0mL Perfusion (Tmax>6.0s) volume: 0mL Mismatch Volume: 0mL Infarction Location:None IMPRESSION: 1. Filling defect in the proximal left ICA, concerning for intraluminal thrombus. No hemodynamically significant stenosis in the neck. 2.  No intracranial large vessel occlusion or significant stenosis. 3. No infarct core or penumbra on CT perfusion. Code stroke imaging results were communicated on 02/13/2022 at 10:06 pm to provider BHAGAT via secure text paging. Electronically Signed   By: Wiliam Ke M.D.   On: 02/13/2022 22:07   IR ANGIO INTRA EXTRACRAN SEL COM CAROTID INNOMINATE BILAT MOD SED  Result Date: 02/16/2022 INDICATION:  Intermittent worsening aphasia, and right-sided weakness associated with right-sided numbness and tingling. 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 EXAM: 1. EMERGENT LARGE VESSEL OCCLUSION THROMBOLYSIS anterior CIRCULATION) COMPARISON:  None available. MEDICATIONS: Vancomycin 1 g IV antibiotic was administered within 1 hour of the procedure. ANESTHESIA/SEDATION: General anesthesia. CONTRAST:  Omnipaque 300 approximately 105 cc. FLUOROSCOPY TIME:  Fluoroscopy Time: 16 minutes 3 seconds (1216 mGy). COMPLICATIONS: None immediate. TECHNIQUE: Following a full explanation of the procedure along with the potential associated complications, an informed witnessed consent was obtained. The risks of intracranial hemorrhage of 10%, worsening neurological deficit, ventilator dependency, death and  inability to revascularize were all reviewed in detail with the patient's spouse. The patient was then put under general anesthesia by the Department of Anesthesiology at [facility]. The right groin was prepped and draped in the usual sterile fashion. Thereafter using modified Seldinger technique, transfemoral access into the right common femoral artery was obtained without difficulty. Over a 0.035 inch guidewire an 8 French 25 cm Pinnacle sheath was inserted. Through this, and also over a 0.035 inch guidewire a combination of a 6 [place] Berenstein support catheter inside of an 087 95 cm balloon guide catheter was advanced and positioned in the distal left common carotid artery. The guidewire, and the 125 cm support catheter were removed. Good aspiration obtained from the hub of the balloon guide catheter. Gentle control arteriogram performed through this demonstrated no evidence of spasms, dissections or of intraluminal filling defects. A control DSA was then performed centered extra cranially and intracranially in the left common carotid artery. FINDINGS: Left common carotid arteriogram demonstrates the left external carotid artery and its major branches to be widely patent. The left internal carotid artery at the bulb demonstrates a lobulated irregular heterogeneous filling defect with a stenosis of approximately 80% on the lateral DSA. More distally, the left internal carotid artery is seen to opacify to the cranial skull base. The petrous, the cavernous and the supraclinoid segments demonstrate wide patency. Arising in the PCOM region is an approximate 3.2 mm x 2.1 mm saccular outpouching separate from the adjacent left posterior communicating artery. The left middle cerebral artery and the left anterior cerebral artery opacify into the capillary and venous phases. No angiographic evidence of intraluminal filling defects or of occlusions is noted. PROCEDURE: A NAV 4/7 mm Emboshield distal protection  device was then flushed occluded in its housing. The filter device was then captured into the delivery microcatheter. The combination of the delivery microcatheter with the 014 inch micro guidewire with a moderate J configuration was then advanced to the distal end of the balloon guide catheter. Using a torque device, the micro guidewire was then gently advanced along the anterior border of the carotid bulb followed by the microcatheter. The combination was then advanced to the cervical petrous junction of the left internal carotid artery. Filter was then deployed under biplane fluoroscopy. With the filter stable, the delivery microcatheter was retrieved and removed. A control arteriogram was then performed through the balloon guide catheter in the left common carotid artery. This demonstrated moderate spasm in the distal left ICA cervical region. This responded to aliquot of a 25 mcg of nitroglycerin intra-arterially. Control arteriogram performed through the balloon guide catheter intracranially demonstrated no evidence of intraluminal filling defects or of occlusions. Measurements were then performed of the left internal carotid artery just distal to the carotid bulb, and proximally into the distal  left common carotid artery. A 4 mm x 30 mm Viatrac 14 balloon microcatheter was then prepped and purged with heparinized saline infusion retrogradely, and with 50% contrast and 50% heparinized saline infusion antegradely. Using the rapid exchange technique, the balloon was then advanced to the proximal left internal carotid artery in the distal left common carotid artery. Proximal flow arrest was then initiated by inflating the balloon in the distal left common carotid artery, with aspiration with a 20 mL syringe through the side port of the balloon guide catheter as a control angioplasty was then performed using micro inflation syringe device via micro tubing to approximately 9 atmospheres achieving a diameter of just  over 4 mm for about a minute. No hemodynamic changes were encountered. Balloons were deflated and retrieved. Flow arrest was reversed as was the aspiration. A control arteriogram performed through the balloon guide catheter demonstrated improved caliber and flow through the proximal left internal carotid artery. A 9-7 mm x 40 mm Xact stent was then chosen for delivery. This was then retrogradely flushed with heparinized saline infusion. Using the rapid exchange technique, the stent delivery apparatus was then advanced distally, and distal and proximal markers were positioned in the desired position. This was then deployed in the usual manner without any difficulty. With the distal protection device held stable, the delivery apparatus was retrieved and removed. A control arteriogram performed through the balloon guide catheter in the distal left common carotid artery demonstrated excellent apposition and near normal caliber of the stented segment of the left internal carotid artery. A control arteriogram performed intracranially continued to demonstrate no evidence of intraluminal filling defects or of occlusions. A CT of the brain was then performed which demonstrated no evidence of intracranial hemorrhage. At this time, the patient was given half a bolus dose of IV cangrelor followed by a 4 hour infusion. At approximately 25 minutes post deployment of the stent, the filter was captured into the filter capture microcatheter which was advanced again using the rapid exchange technique to the proximal marker. The filter device was captured by retrieving the micro guidewire into the capture device and removed together under constant fluoroscopic guidance. Biplane DSA demonstrated continued excellent apposition and flow through the proximal left internal carotid artery without any evidence of intraluminal filling defects. Balloon catheter was then retrieved and removed. A 5 French diagnostic catheter was now advanced  into the right common carotid artery. A control arteriogram performed through this demonstrated the right external carotid artery and its major branches to be widely patent. The right internal carotid artery at the bulb to the cranial skull base demonstrates wide patency. The petrous, the cavernous and the supraclinoid segments demonstrated adequate caliber and flow intracranially. A small 2 mm infundibulum is noted in the PCOM region of the right internal carotid artery. The right middle and the right anterior cerebral arteries opacify into the capillary and venous phases. The diagnostic catheter was removed. An 8 French Angio-Seal closure device was then deployed for hemostasis at the right groin puncture site. Both the posterior tibial pulses were palpable. The dorsalis pedis pulses remained undopplerable unchanged from prior to the procedure. A flat panel CT of the brain demonstrated no evidence of hemorrhagic complications. The patient's general anesthesia was reversed, and patient was extubated without difficulty. Upon recovery, the patient denied any headaches, nausea or vomiting. She was able to move all fours equally. She was able to verbalize appropriately. She was then transferred to the neuro ICU for post revascularization care. 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 PLAN: Follow-up in the clinic 2 weeks post discharge. Electronically Signed   By: Julieanne Cotton M.D.   On: 02/16/2022 09:05   IR ANGIO VERTEBRAL SEL SUBCLAVIAN INNOMINATE UNI R MOD SED  Result Date: 02/16/2022 INDICATION: Intermittent worsening aphasia, and right-sided weakness associated with right-sided numbness and tingling. 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 EXAM: 1. EMERGENT LARGE VESSEL OCCLUSION THROMBOLYSIS anterior CIRCULATION) COMPARISON:  None available. MEDICATIONS: Vancomycin 1 g IV antibiotic was administered within 1 hour of the procedure. ANESTHESIA/SEDATION: General anesthesia. CONTRAST:  Omnipaque 300 approximately 105 cc. FLUOROSCOPY TIME:  Fluoroscopy Time: 16 minutes 3 seconds (1216 mGy). COMPLICATIONS: None immediate. TECHNIQUE: Following a full explanation of the procedure along with the potential associated complications, an informed witnessed consent was obtained. The risks of intracranial hemorrhage of 10%, worsening neurological deficit, ventilator dependency, death and inability to revascularize were all reviewed in detail with the patient's spouse. The patient was then put under general anesthesia by the Department of Anesthesiology at [facility]. The right groin was prepped and draped in the usual sterile fashion. Thereafter using modified Seldinger technique, transfemoral access into the right common femoral artery was obtained without difficulty. Over a 0.035 inch guidewire an 8 French 25 cm Pinnacle sheath was inserted. Through this, and also over a 0.035 inch guidewire a combination of a 6 [place] Berenstein support catheter inside of an 087 95 cm balloon guide catheter was advanced and positioned in the distal left common carotid artery. The guidewire, and the 125 cm support catheter were removed. Good aspiration obtained from the hub of the balloon guide catheter. Gentle control arteriogram performed through this demonstrated no evidence of spasms, dissections or of intraluminal filling defects. A control DSA was then performed centered extra cranially and intracranially in the left common carotid artery. FINDINGS: Left common carotid arteriogram demonstrates the left external carotid artery and its major branches to be widely patent. The  left internal carotid artery at the bulb demonstrates a lobulated irregular heterogeneous filling defect with a stenosis of approximately 80% on the lateral DSA. More distally, the left internal carotid artery is seen to opacify to the cranial skull base. The petrous, the cavernous and the supraclinoid segments demonstrate wide patency. Arising in the PCOM region is an approximate 3.2 mm x 2.1 mm saccular outpouching separate from the adjacent left posterior communicating artery. The left middle cerebral artery and the left anterior cerebral artery opacify into the capillary and venous phases. No angiographic evidence of intraluminal filling defects or of occlusions is noted. PROCEDURE: A NAV 4/7 mm Emboshield distal protection device was then flushed occluded in its housing. The filter device was then captured into the delivery microcatheter. The combination of the delivery microcatheter with the 014 inch micro guidewire with a moderate J configuration was then advanced to the distal end of the balloon guide catheter. Using a torque device, the micro guidewire was then gently advanced along the anterior border of the carotid bulb followed by the microcatheter. The combination was then advanced to the cervical petrous junction of the left internal carotid artery. Filter was then deployed under biplane fluoroscopy. With the filter stable, the delivery microcatheter was retrieved and  removed. A control arteriogram was then performed through the balloon guide catheter in the left common carotid artery. This demonstrated moderate spasm in the distal left ICA cervical region. This responded to aliquot of a 25 mcg of nitroglycerin intra-arterially. Control arteriogram performed through the balloon guide catheter intracranially demonstrated no evidence of intraluminal filling defects or of occlusions. Measurements were then performed of the left internal carotid artery just distal to the carotid bulb, and proximally into  the distal left common carotid artery. A 4 mm x 30 mm Viatrac 14 balloon microcatheter was then prepped and purged with heparinized saline infusion retrogradely, and with 50% contrast and 50% heparinized saline infusion antegradely. Using the rapid exchange technique, the balloon was then advanced to the proximal left internal carotid artery in the distal left common carotid artery. Proximal flow arrest was then initiated by inflating the balloon in the distal left common carotid artery, with aspiration with a 20 mL syringe through the side port of the balloon guide catheter as a control angioplasty was then performed using micro inflation syringe device via micro tubing to approximately 9 atmospheres achieving a diameter of just over 4 mm for about a minute. No hemodynamic changes were encountered. Balloons were deflated and retrieved. Flow arrest was reversed as was the aspiration. A control arteriogram performed through the balloon guide catheter demonstrated improved caliber and flow through the proximal left internal carotid artery. A 9-7 mm x 40 mm Xact stent was then chosen for delivery. This was then retrogradely flushed with heparinized saline infusion. Using the rapid exchange technique, the stent delivery apparatus was then advanced distally, and distal and proximal markers were positioned in the desired position. This was then deployed in the usual manner without any difficulty. With the distal protection device held stable, the delivery apparatus was retrieved and removed. A control arteriogram performed through the balloon guide catheter in the distal left common carotid artery demonstrated excellent apposition and near normal caliber of the stented segment of the left internal carotid artery. A control arteriogram performed intracranially continued to demonstrate no evidence of intraluminal filling defects or of occlusions. A CT of the brain was then performed which demonstrated no evidence of  intracranial hemorrhage. At this time, the patient was given half a bolus dose of IV cangrelor followed by a 4 hour infusion. At approximately 25 minutes post deployment of the stent, the filter was captured into the filter capture microcatheter which was advanced again using the rapid exchange technique to the proximal marker. The filter device was captured by retrieving the micro guidewire into the capture device and removed together under constant fluoroscopic guidance. Biplane DSA demonstrated continued excellent apposition and flow through the proximal left internal carotid artery without any evidence of intraluminal filling defects. Balloon catheter was then retrieved and removed. A 5 French diagnostic catheter was now advanced into the right common carotid artery. A control arteriogram performed through this demonstrated the right external carotid artery and its major branches to be widely patent. The right internal carotid artery at the bulb to the cranial skull base demonstrates wide patency. The petrous, the cavernous and the supraclinoid segments demonstrated adequate caliber and flow intracranially. A small 2 mm infundibulum is noted in the PCOM region of the right internal carotid artery. The right middle and the right anterior cerebral arteries opacify into the capillary and venous phases. The diagnostic catheter was removed. An 8 French Angio-Seal closure device was then deployed for hemostasis at the right groin puncture site. Both  the posterior tibial pulses were palpable. The dorsalis pedis pulses remained undopplerable unchanged from prior to the procedure. A flat panel CT of the brain demonstrated no evidence of hemorrhagic complications. The patient's general anesthesia was reversed, and patient was extubated without difficulty. Upon recovery, the patient denied any headaches, nausea or vomiting. She was able to move all fours equally. She was able to verbalize appropriately. She was then  transferred to the neuro ICU for post revascularization care. 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 PLAN: Follow-up in the clinic 2 weeks post discharge. Electronically Signed   By: Julieanne Cotton M.D.   On: 02/16/2022 09:05      HISTORY OF PRESENT ILLNESS Carylon Tamburro is a 51 y.o. left-handed woman with a past medical history significant for migraine headaches (not on any medications), presenting with headache with right-sided weakness and difficulty speaking/confusion.   She is visiting her daughter Alcario Drought, who recently moved to the area from [place].  She was in her normal state of health today and had gone out to dinner with the family.  Shortly after returning the family had stepped out of the room briefly and came back to find her slumped over with right arm weakness and difficulty communicating, for which EMS was activated.  Her initial blood pressure with EMS was 220/140, glucose within normal limits, with her examination stable over the course of their evaluation and transport   Regarding her headaches, she has a known history of migraine headaches but does not take any preventative medications.  These headaches typically are associated with some confusion and difficulty talking, but she has never had right-sided weakness before.  She will sometimes take Excedrin for her headaches but otherwise does not take any other medications, and her only known allergy is penicillin   TNK checklist was reviewed with patient's daughter at bedside and she confirmed there were no contraindications to TNK   LKW: 18:35  tPA given?: Yes  Premorbid modified rankin scale:      0 - No symptoms.  HOSPITAL COURSE Ms. Avelina Mcclurkin is a 51 y.o. female with history  of migraines presenting with sudden onset right arm weakness and aphasia.  She was evaluated in the ED and given TNK to treat possible stroke.  Symptoms have improved, and MRI reveals punctate infarcts in left frontal and parietal lobes.  She does c/o headache, so fioricet was ordered.  Taken as a code IR for worsening symptoms on 5/30 early in the morning. Stent placed in left ICA by Dr. Corliss Skains. 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 She is now on brilinta and aspirin 81mg .   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 ASPECTS 10.    CTA head & neck filling defect in left ICA concerning for thrombus, no LVO or hemodynamically significant stenosis CT perfusion no core or penumbra CT Head Repeat- No evidence of acute intracranial abnormality.  MRI  punctate infarcts in left frontal and parietal lobes MRI repeat Increased conspicuity and mild progression of scattered but mostly punctate infarcts in the Left MCA and watershed areas.  CTA neck repeat Enlarged thrombus within the Left ICA origin  IR s/p left  ICA stenting and also showed left ICA luminal linear hyperdensity concerning for carotid web. 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 S/p L ICA stenting Likely the source of intraluminal thrombus   Hypotension, improved Home meds:  none May related to carotid thrombus and carotid  stenting Stable now Off IVF S/p albumin Q6h x 4   Hyperlipidemia Home meds:  none LDL 136, goal < 70 Add rosuvastatin 20 mg daily  Continue statin at discharge   Iron deficiency anemia Hemoglobin 9.6->8.0-> 6.3-> 2u PRBCs->9.7 Low MCV and MCH Iron level 14, ferritin 5 Put on iron pills   Mild dyspnea  Occurred 5/31 overnight Likely related to brilinta Dr. Corliss Skains discussed with pt and do not feel need to discontinue brilinta Continue on discharge Hopefully will subside in a short period Pt agrees with the plan  Other Stroke Risk Factors Migraines, 3-4 times per months, lasting 2 days, with nauseous and photophobia, phonophobia - PRN Fioricet.   Left Bell's palsy in 1990s   Other Active Problems Leukocytosis, WBC 11.6 -> 9.0 -> 12.1   DISCHARGE EXAM Blood pressure (!) 151/88, pulse 65, temperature 98.5 F (36.9 C), temperature source Oral, resp. rate 16, weight 79.6 kg, SpO2 98 %. PHYSICAL EXAM General:  Alert, well-developed, well-nourished patient in no acute distress Respiratory:  Regular, unlabored respirations on room air   NEURO:  Mental Status: AA&Ox3  Speech/Language: speech is without dysarthria or aphasia.  Repetition, fluency, and comprehension intact.   Cranial Nerves:  II: PERRL. Visual fields full.  III, IV, VI: EOMI. Eyelids elevate symmetrically.  V: Sensation is intact to light touch and symmetrical to face.  VII: Smile is symmetrical.   VIII: hearing intact to voice. IX, X: Phonation is normal.  [id number] shrug 5/5. XII: tongue is midline without fasciculations. Motor: 5/5 strength to all muscle groups tested.  Tone: is normal and bulk is normal Sensation- Intact to light touch bilaterally.  Coordination: No ataxia on FNF Gait- Steady   DISCHARGE PLAN Disposition:  Discharge home Brilinta (ticagrelor) 90 mg bid and ASA 81mg  for secondary stroke prevention Ongoing stroke risk factor control by Primary Care Physician at time of  discharge PCP to send referral to neurologist in [place], [place] Follow-up PCP Pcp, No in 2 weeks.  40 minutes were spent preparing discharge.  Patient seen and examined by NP/APP with MD. MD to update note as needed.   Elmer Picker, DNP, FNP-BC [facility] Pager: [phone]  ATTENDING NOTE: I reviewed above note and agree with the assessment and plan. Pt was seen and examined.   No family at bedside.  Patient lying in bed, no distress.  Overnight patient had mild dyspnea, repeat CTA head and neck unremarkable, stent patent.  Chest x-ray with some bronchitic changes.  EKG no change.  Consider side effect from Brilinta.  This morning patient seems minimum symptoms.  Discussed with Dr. Corliss Skains, recommend to continue Brilinta without change.  Patient agreed with the plan.  Otherwise patient neurologic intact, ready for discharge.  Patient will follow up with PCP at her  hometown in [place] and will have neurology referral by her PCP when she back home.  For detailed assessment and plan, please refer to above as I have made changes wherever appropriate.   Marvel Plan, MD PhD Stroke Neurology 02/17/2022 5:17 PM

## 2022-02-17 NOTE — Progress Notes (Addendum)
Patient ID: Erika Jordan, female   DOB: [birth year], 51 y.o.   MRN: [id number]  Notified by nursing the patient was having a sensation of neck tightness and difficulty breathing. She is s/p stent for carotid web and received 1 unit of blood today  Examination is notable for alert, oriented, mildly anxious appearing patient, sitting bolt upright to relieve the sensation in her throat.  Additionally she reports that her headache worsens when she lays flat, and that this event woke her from her sleep  General exam notable for no stigmata of retroperitoneal bleed on the back or periumbilical area, mild neck fullness on the left and tenderness to palpation with headache referred into the head Pulmonary exam notable for some crackles in the left lung base but no wheezing Radial pulses intact bilaterally with heart rates in the 70s, normal saturation on room air  Conversational language is intact, she follows commands Visual fields are full to confrontation, pupils equal round reactive, face with a slight left facial droop (baseline), uvula elevates symmetrically, tongue midline, No pronator drift in the bilateral upper extremities.  Using both lower extremities equally  Hemoglobin trend 9.6 -> 8 -> 6.3, transfused -> 9.7  EKG obtained and appears stable on my read (low voltage, sinus versus atrial ectopy)  Differential includes transfusion reaction to blood products (less likely given benign lung exam, no hypoxia), intracerebral hemorrhage (given HA worse with laying down), stent complication given neck pain, symptomatic anemia (recent Hgb drop though suspect dilutional lab given large increase post 1 unit of blood), side effect of Brilinta (diagnosis of exclusion)  Plan -Stat head CT (negative on my read, pending radiology read)  -CTA (patent stent on my read, pending radiology read) -Stat labs, BMP, CBC, magnesium, troponin -Chest x-ray

## 2022-02-18 LAB — METHYLMALONIC ACID, SERUM: Methylmalonic Acid, Quantitative: 189 nmol/L (ref 0–378)

## 2022-02-18 SURGERY — ECHOCARDIOGRAM, TRANSESOPHAGEAL
Anesthesia: Monitor Anesthesia Care

## 2022-02-21 LAB — FACTOR 5 LEIDEN

## 2022-02-21 LAB — PROTHROMBIN GENE MUTATION

## 2022-02-23 ENCOUNTER — Telehealth (HOSPITAL_COMMUNITY): Payer: Self-pay

## 2022-02-23 NOTE — Telephone Encounter (Signed)
Called to schedule 2 wk f/u. Pt lives in [place], [place]. She has an appt set up today to get a referral to neurology. AW

## 2022-05-30 ENCOUNTER — Other Ambulatory Visit: Payer: Self-pay

## 2022-05-30 NOTE — Patient Outreach (Signed)
  Care Coordination   05/30/2022 Name: Erika Jordan MRN: [id number] DOB: Aug 19, 1971    Telephone outreach to patient to obtain mRS was successfully completed. MRS= 1  Vanice Sarah [facility] Care Management Assistant [phone]

## 2023-12-13 ENCOUNTER — Other Ambulatory Visit: Payer: Self-pay | Admitting: Medical Genetics

## 2024-03-15 ENCOUNTER — Encounter (HOSPITAL_COMMUNITY): Payer: Self-pay | Admitting: Interventional Radiology

## 2024-07-15 ENCOUNTER — Other Ambulatory Visit: Payer: Self-pay | Admitting: Medical Genetics

## 2024-07-15 DIAGNOSIS — Z006 Encounter for examination for normal comparison and control in clinical research program: Secondary | ICD-10-CM

## 2024-08-16 LAB — GENECONNECT MOLECULAR SCREEN: Genetic Analysis Overall Interpretation: NEGATIVE
